# Patient Record
Sex: Female | Born: 1940 | Race: White | Hispanic: No | Marital: Married | Smoking: Never smoker
Health system: Southern US, Community
[De-identification: ages and names within clinical notes are randomized; demographics above are authoritative.]

## PROBLEM LIST (undated history)

## (undated) DIAGNOSIS — C801 Malignant (primary) neoplasm, unspecified: Secondary | ICD-10-CM

---

## 2018-01-13 ENCOUNTER — Inpatient Hospital Stay (HOSPITAL_COMMUNITY)
Admission: EM | Admit: 2018-01-13 | Discharge: 2018-01-16 | DRG: 690 | Disposition: A | Discharge status: Home Health | Payer: Medicare Other | Attending: Internal Medicine | Admitting: Internal Medicine

## 2018-01-13 ENCOUNTER — Encounter (HOSPITAL_COMMUNITY): Payer: Self-pay | Admitting: Emergency Medicine

## 2018-01-13 ENCOUNTER — Emergency Department (HOSPITAL_COMMUNITY): Payer: Medicare Other

## 2018-01-13 ENCOUNTER — Other Ambulatory Visit: Payer: Self-pay

## 2018-01-13 DIAGNOSIS — F0390 Unspecified dementia without behavioral disturbance: Secondary | ICD-10-CM | POA: Diagnosis present

## 2018-01-13 DIAGNOSIS — F05 Delirium due to known physiological condition: Secondary | ICD-10-CM | POA: Diagnosis present

## 2018-01-13 DIAGNOSIS — I609 Nontraumatic subarachnoid hemorrhage, unspecified: Secondary | ICD-10-CM

## 2018-01-13 DIAGNOSIS — R410 Disorientation, unspecified: Secondary | ICD-10-CM

## 2018-01-13 DIAGNOSIS — E039 Hypothyroidism, unspecified: Secondary | ICD-10-CM | POA: Diagnosis present

## 2018-01-13 DIAGNOSIS — N390 Urinary tract infection, site not specified: Principal | ICD-10-CM | POA: Diagnosis present

## 2018-01-13 DIAGNOSIS — G9340 Encephalopathy, unspecified: Secondary | ICD-10-CM

## 2018-01-13 DIAGNOSIS — Z79890 Hormone replacement therapy (postmenopausal): Secondary | ICD-10-CM

## 2018-01-13 HISTORY — DX: Malignant (primary) neoplasm, unspecified: C80.1

## 2018-01-13 LAB — COMPREHENSIVE METABOLIC PANEL
ALBUMIN: 4.1 g/dL (ref 3.5–5.0)
ALK PHOS: 84 U/L (ref 38–126)
ALT: 17 U/L (ref 0–44)
AST: 17 U/L (ref 15–41)
Anion gap: 10 (ref 5–15)
BUN: 22 mg/dL (ref 8–23)
CHLORIDE: 107 mmol/L (ref 98–111)
CO2: 23 mmol/L (ref 22–32)
CREATININE: 0.82 mg/dL (ref 0.44–1.00)
Calcium: 10.3 mg/dL (ref 8.9–10.3)
GFR calc Af Amer: >60 mL/min (ref >60)
GFR calc non Af Amer: >60 mL/min (ref >60)
GLUCOSE: 91 mg/dL (ref 70–99)
Potassium: 3.9 mmol/L (ref 3.5–5.1)
SODIUM: 140 mmol/L (ref 135–145)
Total Bilirubin: 0.5 mg/dL (ref 0.3–1.2)
Total Protein: 7.5 g/dL (ref 6.5–8.1)

## 2018-01-13 LAB — CBC WITH DIFFERENTIAL/PLATELET
Abs Immature Granulocytes: 0 10*3/uL (ref 0.0–0.1)
Basophils Absolute: 0.1 10*3/uL (ref 0.0–0.1)
Basophils Relative: 1 %
EOS ABS: 0.1 10*3/uL (ref 0.0–0.7)
EOS PCT: 1 %
HEMATOCRIT: 46.8 % — AB (ref 36.0–46.0)
Hemoglobin: 14.3 g/dL (ref 12.0–15.0)
IMMATURE GRANULOCYTES: 0 %
LYMPHS ABS: 1.7 10*3/uL (ref 0.7–4.0)
Lymphocytes Relative: 22 %
MCH: 28.7 pg (ref 26.0–34.0)
MCHC: 30.6 g/dL (ref 30.0–36.0)
MCV: 94 fL (ref 78.0–100.0)
Monocytes Absolute: 1 10*3/uL (ref 0.1–1.0)
Monocytes Relative: 12 %
NEUTROS PCT: 64 %
Neutro Abs: 5.1 10*3/uL (ref 1.7–7.7)
Platelets: 216 10*3/uL (ref 150–400)
RBC: 4.98 MIL/uL (ref 3.87–5.11)
RDW: 14.9 % (ref 11.5–15.5)
WBC: 7.9 10*3/uL (ref 4.0–10.5)

## 2018-01-13 LAB — TSH: TSH: 0.445 u[IU]/mL (ref 0.350–4.500)

## 2018-01-13 NOTE — ED Notes (Addendum)
Pt states bday is 1941/07/06.  I will call registration.  Waiting on new labels.

## 2018-01-13 NOTE — ED Notes (Signed)
Pt found to be redressing herself in room when this RN went into room for assessment.

## 2018-01-13 NOTE — ED Notes (Signed)
Transported to MRI

## 2018-01-13 NOTE — ED Notes (Addendum)
At [place] request, contact information for Pt's husband, who is currently on 4NP room 09. His RN Advertising account planner) can be contacted at [phone].   Marya Amsler (Daughter): [phone] Barrett Henle (Son-in-law): [phone]

## 2018-01-13 NOTE — ED Triage Notes (Signed)
Pt BIB family for evaluation of undiagnosed dementia. Pt's husband is a pt on an inpatient unit, pt has been confused and declining x years. Husband's recent admission, pt has become belligerent, and "hoarding" money.

## 2018-01-13 NOTE — ED Provider Notes (Signed)
[facility] EMERGENCY DEPARTMENT Provider Note   CSN: [id number] Arrival date & time: 01/13/18  1737     History   Chief Complaint Chief Complaint  Patient presents with  . Altered Mental Status    HPI Cheyenne Montes is a 77 y.o. female.  HPI  She here with family members, who are concerned that she is unstable, and cannot go home.  Her husband was admitted to the hospital, 5 days ago for rib injuries, and has required a prolonged stay.  The patient has been staying with her husband and is apparently very bothersome to staff, noted to be belligerent, and having difficulty with redirection.  The patient is unable to give history.  History is given by the patient's son-in-law.  Level 5 caveat-confusion  Past Medical History:  Diagnosis Date  . Cancer [facility])    breast    There are no active problems to display for this patient.   History reviewed. No pertinent surgical history.   OB History   None      Home Medications    Prior to Admission medications   Medication Sig Start Date End Date Taking? Authorizing Provider  levothyroxine (SYNTHROID, LEVOTHROID) 75 MCG tablet Take 37.5 mcg by mouth daily. 01/03/18  Yes [provider]    Family History No family history on file.  Social History Social History   Tobacco Use  . Smoking status: Not on file  Substance Use Topics  . Alcohol use: Not on file  . Drug use: Not on file     Allergies   Patient has no known allergies.   Review of Systems Review of Systems  Unable to perform ROS: Mental status change     Physical Exam Updated Vital Signs BP (!) 135/52 (BP Location: Right Arm)   Pulse 92   Temp 97.8 F (36.6 C) (Oral)   Resp 16   SpO2 97%   Physical Exam  Constitutional: She appears well-developed. No distress.  Frail, elderly  HENT:  Head: Normocephalic and atraumatic.  Eyes: Pupils are equal, round, and reactive to light. Conjunctivae and EOM are normal.    Neck: Normal range of motion and phonation normal. Neck supple.  Cardiovascular: Normal rate and regular rhythm.  Pulmonary/Chest: Effort normal and breath sounds normal. She exhibits no tenderness.  Musculoskeletal: Normal range of motion.  Neurological: She is alert. She exhibits normal muscle tone.  Oriented to person, and roughly to the date.  She did not know the year.  She did not know where she was.  No dysarthria.  Skin: Skin is warm and dry.  Psychiatric:  Suspicious, confused, anxious.  Nursing note and vitals reviewed.    ED Treatments / Results  Labs (all labs ordered are listed, but only abnormal results are displayed) Labs Reviewed - No data to display  EKG None  Radiology No results found.  Procedures Procedures (including critical care time)  Medications Ordered in ED Medications - No data to display   Initial Impression / Assessment and Plan / ED Course  I have reviewed the triage vital signs and the nursing notes.  Pertinent labs & imaging results that were available during my care of the patient were reviewed by me and considered in my medical decision making (see chart for details).      Patient Vitals for the past 24 hrs:  BP Temp Temp src Pulse Resp SpO2  01/13/18 1824 (!) 135/52 97.8 F (36.6 C) Oral 92 16 97 %  01/13/18 1756 133/64 98.2 F (36.8 C) Oral 86 14 93 %      Medical Decision Making: Confusion c/w delirium. Abnormal CT required MRI imaging to evaluate for acute hemorrhage. 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 Labs pending to evaluate for medical induced confusion.  CRITICAL CARE- No Performed by: Daleen Bo   Nursing Notes Reviewed/ Care Coordinated Applicable Imaging Reviewed Interpretation of Laboratory Data incorporated into ED treatment  Disposition per on-coming team after testing returns.   Final Clinical Impressions(s) / ED Diagnoses   Final diagnoses:  None    ED Discharge Orders    None       Daleen Bo, MD 01/14/18 1715

## 2018-01-13 NOTE — ED Notes (Signed)
States she could not provide urine specimen at this time.

## 2018-01-13 NOTE — ED Notes (Signed)
Registration currently in room.

## 2018-01-13 NOTE — ED Notes (Signed)
Patient transported to MRI 

## 2018-01-13 NOTE — ED Provider Notes (Signed)
Patient signed out to me at shift change.  She has been acting bizarre and having strange behavior according to the nurses upstairs, who have been involved in the care of the patient's husband.  Questionable underlying psychiatric illness, however patient was sent to the emergency department for evaluation of delirium.  It was found that she had a small subarachnoid hemorrhage in her cerebellar region.  She is moving all extremities, and is grossly neurologically intact, but is disoriented and delirious.  I discussed the patient with Dr. Vertell Limber from neurosurgery, who states that this is not an aneurysmal presentation, and recommends neurology consultation and admission.  Discussed the patient with Dr. Rory Percy, who states no intervention from neurology required as patient is not hypertensive.  He recommends working up other etiologies for delirium.  Appreciate Dr. Hal Hope for admitting the patient.  Urinalysis is concerning for UTI, which could be exacerbating the patient's symptoms.    CRITICAL CARE Performed by: Montine Circle Subarachnoid hemorrhage, consultation with multiple specialists.  Total critical care time: 35 minutes  Critical care time was exclusive of separately billable procedures and treating other patients.  Critical care was necessary to treat or prevent imminent or life-threatening deterioration.  Critical care was time spent personally by me on the following activities: development of treatment plan with patient and/or surrogate as well as nursing, discussions with consultants, evaluation of patient's response to treatment, examination of patient, obtaining history from patient or surrogate, ordering and performing treatments and interventions, ordering and review of laboratory studies, ordering and review of radiographic studies, pulse oximetry and re-evaluation of patient's condition.    Montine Circle, PA-C 01/14/18 1624    Daleen Bo, MD 01/14/18 1710

## 2018-01-13 NOTE — Progress Notes (Signed)
[facility] ED CSW spoke with pt's son in law, [place]. Pt did not participate in the conversation, but gave verbal permission for Suezanne Jacquet to speak with CSW and RN CM. Pt's family informed staff that pt needs to be evaluated and diagnosed with dimentia. Staff explained to family that is something a PCP and/or neurologist would do and encouraged family to follow up with pt's PCP. CSW answered questions about placement for pt. CSW explained that pt would have to be agreeable to going to a facility and that ALFs/Memory Cares are paid for by out of pocket or medicaid. CSW answered questions from family about Medical Power of Attorney and Chief Financial Officer and the process for both. CSW explained to family that pt would have to be deemed incompetent for them to obtain legal guardianship of pt.   Please re-consult for further social work needs. CSW signing off.   Wendelyn Breslow, [facility]  [phone]

## 2018-01-13 NOTE — ED Provider Notes (Signed)
Patient placed in Quick Look pathway, seen and evaluated   Chief Complaint: change in mental status  HPI: Cheyenne Montes is a 77 y.o. femalet BIB family for evaluation of undiagnosed dementia. Pt's husband is a pt on an inpatient unit, pt has been confused and declining x years. Husband's recent admission, pt has become belligerent, and "hoarding" money.    Patient states she was brought in to the hospital today by her husband and daughter-in-low and patient does not know why and just wants to go home.   ROS: patient reports no problems, just wants to go home.  Physical Exam:  BP 133/64 (BP Location: Right Arm)   Pulse 86   Temp 98.2 F (36.8 C) (Oral)   Resp 14   SpO2 93%    Gen: No distress, patient was in room alone and talking when I came into the room.   Neuro: Awake and Alert,   Skin: Warm and dry     Initiation of care has begun. The patient has been counseled on the process, plan, and necessity for staying for the completion/evaluation, and the remainder of the medical screening examination    Cheyenne Murrain, NP 01/13/18 Cheyenne Montes    Cheyenne Furry, MD 01/13/18 2229

## 2018-01-14 ENCOUNTER — Encounter (HOSPITAL_COMMUNITY): Payer: Self-pay | Admitting: Internal Medicine

## 2018-01-14 DIAGNOSIS — I609 Nontraumatic subarachnoid hemorrhage, unspecified: Secondary | ICD-10-CM

## 2018-01-14 DIAGNOSIS — N390 Urinary tract infection, site not specified: Secondary | ICD-10-CM | POA: Diagnosis present

## 2018-01-14 DIAGNOSIS — G9340 Encephalopathy, unspecified: Secondary | ICD-10-CM

## 2018-01-14 DIAGNOSIS — R410 Disorientation, unspecified: Secondary | ICD-10-CM

## 2018-01-14 DIAGNOSIS — E039 Hypothyroidism, unspecified: Secondary | ICD-10-CM | POA: Diagnosis not present

## 2018-01-14 LAB — BASIC METABOLIC PANEL
ANION GAP: 8 (ref 5–15)
BUN: 16 mg/dL (ref 8–23)
CALCIUM: 9.9 mg/dL (ref 8.9–10.3)
CO2: 26 mmol/L (ref 22–32)
CREATININE: 0.69 mg/dL (ref 0.44–1.00)
Chloride: 106 mmol/L (ref 98–111)
Glucose, Bld: 96 mg/dL (ref 70–99)
Potassium: 3.5 mmol/L (ref 3.5–5.1)
SODIUM: 140 mmol/L (ref 135–145)

## 2018-01-14 LAB — CBC WITH DIFFERENTIAL/PLATELET
Abs Immature Granulocytes: 0 10*3/uL (ref 0.0–0.1)
BASOS ABS: 0.1 10*3/uL (ref 0.0–0.1)
BASOS PCT: 1 %
EOS ABS: 0.1 10*3/uL (ref 0.0–0.7)
Eosinophils Relative: 2 %
HEMATOCRIT: 42.9 % (ref 36.0–46.0)
Hemoglobin: 13.6 g/dL (ref 12.0–15.0)
IMMATURE GRANULOCYTES: 0 %
Lymphocytes Relative: 20 %
Lymphs Abs: 1.3 10*3/uL (ref 0.7–4.0)
MCH: 28.8 pg (ref 26.0–34.0)
MCHC: 31.7 g/dL (ref 30.0–36.0)
MCV: 90.9 fL (ref 78.0–100.0)
MONOS PCT: 14 %
Monocytes Absolute: 0.9 10*3/uL (ref 0.1–1.0)
NEUTROS ABS: 4.1 10*3/uL (ref 1.7–7.7)
NEUTROS PCT: 63 %
Platelets: 243 10*3/uL (ref 150–400)
RBC: 4.72 MIL/uL (ref 3.87–5.11)
RDW: 14.6 % (ref 11.5–15.5)
WBC: 6.5 10*3/uL (ref 4.0–10.5)

## 2018-01-14 LAB — URINALYSIS, ROUTINE W REFLEX MICROSCOPIC
Bilirubin Urine: NEGATIVE
GLUCOSE, UA: NEGATIVE mg/dL
Ketones, ur: 20 mg/dL — AB
Leukocytes, UA: NEGATIVE
Nitrite: POSITIVE — AB
PH: 6 (ref 5.0–8.0)
Protein, ur: NEGATIVE mg/dL
SPECIFIC GRAVITY, URINE: 1.016 (ref 1.005–1.030)

## 2018-01-14 LAB — HEPATIC FUNCTION PANEL
ALT: 19 U/L (ref 0–44)
AST: 21 U/L (ref 15–41)
Albumin: 3.8 g/dL (ref 3.5–5.0)
Alkaline Phosphatase: 88 U/L (ref 38–126)
BILIRUBIN DIRECT: 0.2 mg/dL (ref 0.0–0.2)
BILIRUBIN INDIRECT: 0.6 mg/dL (ref 0.3–0.9)
BILIRUBIN TOTAL: 0.8 mg/dL (ref 0.3–1.2)
Total Protein: 7.3 g/dL (ref 6.5–8.1)

## 2018-01-14 LAB — RAPID URINE DRUG SCREEN, HOSP PERFORMED
Amphetamines: NOT DETECTED
BENZODIAZEPINES: NOT DETECTED
Cocaine: NOT DETECTED
Opiates: NOT DETECTED
TETRAHYDROCANNABINOL: NOT DETECTED

## 2018-01-14 LAB — RPR: RPR Ser Ql: NONREACTIVE

## 2018-01-14 LAB — AMMONIA: Ammonia: 30 umol/L (ref 9–35)

## 2018-01-14 LAB — VITAMIN B12: VITAMIN B 12: 519 pg/mL (ref 180–914)

## 2018-01-14 MED ORDER — SODIUM CHLORIDE 0.9 % IV SOLN
1.0000 g | INTRAVENOUS | Status: DC
  Administered 2018-01-14 – 2018-01-15 (×2): 1 g via INTRAVENOUS
  Filled 2018-01-14 (×3): qty 10

## 2018-01-14 MED ORDER — ONDANSETRON HCL 4 MG/2ML IJ SOLN: 4.0000 mg | Freq: Four times a day (QID) | INTRAMUSCULAR | Status: DC | PRN

## 2018-01-14 MED ORDER — ACETAMINOPHEN 650 MG RE SUPP
650.0000 mg | Freq: Four times a day (QID) | RECTAL | Status: DC | PRN
Start: 2018-01-14 — End: 2018-01-16

## 2018-01-14 MED ORDER — ONDANSETRON HCL 4 MG PO TABS: 4.0000 mg | ORAL_TABLET | Freq: Four times a day (QID) | ORAL | Status: DC | PRN

## 2018-01-14 MED ORDER — LEVOTHYROXINE SODIUM 75 MCG PO TABS
37.5000 ug | ORAL_TABLET | Freq: Every day | ORAL | Status: DC
  Administered 2018-01-14 – 2018-01-16 (×3): 37.5 ug via ORAL
  Filled 2018-01-14 (×3): qty 1

## 2018-01-14 MED ORDER — ACETAMINOPHEN 325 MG PO TABS: 650.0000 mg | ORAL_TABLET | Freq: Four times a day (QID) | ORAL | Status: DC | PRN

## 2018-01-14 MED ORDER — SODIUM CHLORIDE 0.9 % IV SOLN
1.0000 g | Freq: Once | INTRAVENOUS | Status: AC
  Administered 2018-01-14: 1 g via INTRAVENOUS
  Filled 2018-01-14: qty 10

## 2018-01-14 NOTE — H&P (Signed)
History and Physical    Cheyenne Montes YQI:[id number] DOB: 15-Sep-1940 DOA: 01/13/2018  PCP: Patient, No Pcp Per  Patient coming from: Home.  Chief Complaint: Confusion.  HPI: Cheyenne Montes is a 77 y.o. female with history of hypothyroidism was found to be confused.  Patient is visiting hospital as her husband was hospitalized.  Nurses noted that patient was getting confused and delirious.  Was referred to the ER.  Patient does not complain of any headache nausea vomiting or any weakness or fall.  ED Course: In the ER patient is afebrile and appears nonfocal but confused.  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  On-call neurosurgeon Dr. Vertell Limber was consulted by the ER physician.  Dr. Vertell Limber advised that this is not a similar presentation and recommended neurology consult.  Dr. Malen Gauze of neurology was contacted and at this time Dr. Malen Gauze felt that patient does not have any acute bleed and is chronic and does not need any further work-up.  UA shows features consistent with UTI with ceftriaxone was started on patient's confusion is likely from UTI as per the neurology.  Review of Systems: As per HPI, rest all negative.   Past Medical History:  Diagnosis Date  . Cancer [facility])    breast    History reviewed. No pertinent surgical history.   reports that she has never smoked. She has never used smokeless tobacco. Her alcohol and drug histories are not on file.  No Known Allergies  Family History  Problem Relation Age of Onset  . Diabetes Mellitus II Neg Hx   . CAD Neg Hx     Prior to Admission medications   Medication Sig Start Date End Date Taking? Authorizing Provider  levothyroxine (SYNTHROID, LEVOTHROID) 75 MCG tablet Take 37.5 mcg by mouth daily. 01/03/18  Yes [provider]    Physical Exam: Vitals:   01/14/18 0000 01/14/18 0100 01/14/18 0200 01/14/18 0334  BP: (!) 126/59 (!)  114/53 (!) 105/52 138/78  Pulse: 85 70 72 90  Resp:    18  Temp:    97.6 F (36.4 C)  TempSrc:    Oral  SpO2: 96% 94% 94% 99%      Constitutional: Moderately built and nourished. Vitals:   01/14/18 0000 01/14/18 0100 01/14/18 0200 01/14/18 0334  BP: (!) 126/59 (!) 114/53 (!) 105/52 138/78  Pulse: 85 70 72 90  Resp:    18  Temp:    97.6 F (36.4 C)  TempSrc:    Oral  SpO2: 96% 94% 94% 99%   Eyes: Anicteric no pallor. ENMT: No discharge from the ears eyes nose or mouth. Neck: No mass palpated no neck rigidity no JVD appreciated. Respiratory: No rhonchi or crepitations. Cardiovascular: S1-S2 heard no murmurs appreciated. Abdomen: Soft nontender bowel sounds present. Musculoskeletal: No edema. Skin: No rash. Neurologic: Alert awake oriented to person and place.  Moves all extremity's 5 x 5. Psychiatric: Appears mildly confused.   Labs on Admission: I have personally reviewed following labs and imaging studies  CBC: Recent Labs  Lab 01/13/18 2020  WBC 7.9  NEUTROABS 5.1  HGB 14.3  HCT 46.8*  MCV 94.0  PLT 387   Basic Metabolic Panel: Recent Labs  Lab 01/13/18 2020  NA 140  K 3.9  CL 107  CO2 23  GLUCOSE 91  BUN 22  CREATININE 0.82  CALCIUM 10.3   GFR: CrCl cannot be calculated (  Unknown ideal weight.). Liver Function Tests: Recent Labs  Lab 01/13/18 2020  AST 17  ALT 17  ALKPHOS 84  BILITOT 0.5  PROT 7.5  ALBUMIN 4.1   No results for input(s): LIPASE, AMYLASE in the last 168 hours. No results for input(s): AMMONIA in the last 168 hours. Coagulation Profile: No results for input(s): INR, PROTIME in the last 168 hours. Cardiac Enzymes: No results for input(s): CKTOTAL, CKMB, CKMBINDEX, TROPONINI in the last 168 hours. BNP (last 3 results) No results for input(s): PROBNP in the last 8760 hours. HbA1C: No results for input(s): HGBA1C in the last 72 hours. CBG: No results for input(s): GLUCAP in the last 168 hours. Lipid Profile: No results  for input(s): CHOL, HDL, LDLCALC, TRIG, CHOLHDL, LDLDIRECT in the last 72 hours. Thyroid Function Tests: Recent Labs    01/13/18 2020  TSH 0.445   Anemia Panel: No results for input(s): VITAMINB12, FOLATE, FERRITIN, TIBC, IRON, RETICCTPCT in the last 72 hours. Urine analysis:    Component Value Date/Time   COLORURINE YELLOW 01/14/2018 0128   APPEARANCEUR HAZY (A) 01/14/2018 0128   LABSPEC 1.016 01/14/2018 0128   PHURINE 6.0 01/14/2018 0128   GLUCOSEU NEGATIVE 01/14/2018 0128   HGBUR SMALL (A) 01/14/2018 0128   BILIRUBINUR NEGATIVE 01/14/2018 0128   KETONESUR 20 (A) 01/14/2018 0128   PROTEINUR NEGATIVE 01/14/2018 0128   NITRITE POSITIVE (A) 01/14/2018 0128   LEUKOCYTESUR NEGATIVE 01/14/2018 0128   Sepsis Labs: @LABRCNTIP (procalcitonin:4,lacticidven:4) )No results found for this or any previous visit (from the past 240 hour(s)).   Radiological Exams on Admission: Ct Head Wo Contrast  Result Date: 01/13/2018 CLINICAL DATA:  Altered mental status EXAM: CT HEAD WITHOUT CONTRAST TECHNIQUE: Contiguous axial images were obtained from the base of the skull through the vertex without intravenous contrast. COMPARISON:  None. FINDINGS: Brain: There is a small focus of hyperattenuation in the inferior right cerebellum (axial image 6, sagittal image 26). There is generalized atrophy without lobar predilection. There is an old posterior right MCA territory infarct with associated encephalomalacia. There is hypoattenuation of the periventricular white matter, most commonly indicating chronic ischemic microangiopathy. Vascular: Atherosclerotic calcification of the internal carotid arteries at the skull base. No abnormal hyperdensity of the major intracranial arteries or dural venous sinuses. Skull: The visualized skull base, calvarium and extracranial soft tissues are normal. Sinuses/Orbits: No fluid levels or advanced mucosal thickening of the visualized paranasal sinuses. No mastoid or middle ear  effusion. The orbits are normal. IMPRESSION: 1. Small focus of suspected acute to subacute subarachnoid hemorrhage in the right cerebellum. No associated mass effect. This may be posttraumatic, but MRI might be helpful to exclude an associated lesion, such as a cavernoma. 2. 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 Critical Value/emergent results were called by telephone at the time of interpretation on 01/13/2018 at 8:41 pm to Dr. Daleen Bo , who verbally acknowledged these results. Electronically Signed   By: Ulyses Jarred M.D.   On: 01/13/2018 20:52   Mr Brain Wo Contrast  Addendum Date: 01/14/2018   ADDENDUM REPORT: 01/14/2018 02:27 ADDENDUM: Subarachnoid hemorrhage within the right inferior cerebellar hemisphere demonstrates low signal on all sequences compatible with chronic hemorrhage. Increased density on CT is likely mineralization associated with the chronic hemorrhage. No edema or mass effect. These findings were discussed on 01/14/2018 at 2:25 am with Dr. Rory Percy. Electronically Signed   By: Kristine Garbe M.D.   On: 01/14/2018 02:27   Result Date: 01/14/2018 CLINICAL DATA:  77 y/o F;  stroke for follow-up. Altered mental status. EXAM: MRI HEAD WITHOUT CONTRAST TECHNIQUE: Multiplanar, multiecho pulse sequences of the brain and surrounding structures were obtained without intravenous contrast. COMPARISON:  01/13/2018 CT head FINDINGS: Brain: No acute infarction, hydrocephalus, extra-axial collection or mass lesion. Chronic right posterior MCA distribution infarction involving the lateral temporal and parietal lobes. Multiple small chronic infarcts within the bilateral cerebellar hemispheres. Ex vacuo dilatation of the temporal horn of right lateral ventricle. Patchy nonspecific foci of T2 FLAIR hyperintense signal abnormality in subcortical and periventricular white matter are compatible with moderate chronic microvascular ischemic changes for age.  Moderate brain parenchymal volume loss. There is hemosiderin staining of a infarct in the right posterior temporal lobe area of infarction. Small volume of subarachnoid hemorrhage overlying the right inferior cerebellar hemisphere. Vascular: Normal flow voids. Skull and upper cervical spine: Normal marrow signal. Sinuses/Orbits: Small mucous retention cyst in the right maxillary sinus. Right globe intra-ocular lens replacement. Other: None. IMPRESSION: 1. No acute/early subacute infarct, focal mass effect, herniation. 2. Stable small focus of subarachnoid hemorrhage overlying right inferior cerebellum given differences in technique. 3. Chronic right posterior MCA distribution infarction and multiple small chronic infarctions within the bilateral cerebellar hemispheres. 4. Moderate for age chronic microvascular ischemic changes and parenchymal volume loss of the brain. Electronically Signed: By: Kristine Garbe M.D. On: 01/13/2018 22:25      Assessment/Plan Principal Problem:   Acute encephalopathy Active Problems:   Delirium   Hypothyroidism   SAH (subarachnoid hemorrhage) (HCC)   Acute lower UTI    1. Acute encephalopathy -discussed with neurologist Dr. Malen Gauze who at this time feels patient's subarachnoid hemorrhage seen on MRI is likely chronic and does not require any intervention and patient's confusion is likely from UTI for which patient is already placed on ceftriaxone follow urine cultures.  In addition we will check ammonia TSH RPR and folate and B12 levels. 2. Subarachnoid hemorrhage -as per the neurologist is chronic and does not require any intervention.  Neurosurgeon at this time has recommended nonsurgical does not have any signs of aneurysm. 3. Hypothyroidism on Synthroid.   DVT prophylaxis: SCDs due to subarachnoid hemorrhage. Code Status: Full code. Family Communication: No family at the bedside. Disposition Plan: To be determined. Consults called: Physical  therapy. Admission status: Observation.   Rise Patience MD [facility] Pager [phone].  If 7PM-7AM, please contact night-coverage [email or web address] Password [facility]  01/14/2018, 5:19 AM

## 2018-01-14 NOTE — Progress Notes (Signed)
Pt was concerned about having Prescription Synthroid she brought from home with her and in her pocket. I informed pt that it was preferred to keep medicine in patient belonging bag in room so that it can be sure medicine is not being administered more frequently than prescribed (since pt exhibited signs of confusion). Pt appeared anxious and upset about not being able to keep medicine on person.   RN was told by patient experience representative when rounding that patient requested to have belonging bag. When bag was given to her, patient removed prescription synthroid medicine and put bottle with pills into her coat pocket.

## 2018-01-14 NOTE — Progress Notes (Signed)
Patient admitted to [phone] alert and oriented to self and place, No family member present. Made her comfortable in bed and cardiac monitor applied. Safety measures put in place and call bell placed within reach. Will keep monitoring

## 2018-01-14 NOTE — Progress Notes (Signed)
Patient seen and examined, admitted earlier this morning by Dr. Raliegh Ip -Briefly Cheyenne Montes is a 77 year old female with hypothyroidism and ongoing memory, cognitive decline and confusion for more than one year. -her husband who cares for her is currently [place] And patient was noted to be belligerent confused by his side, subsequently brought by stepchildren to the emergency room.  Progressive cognitive decline, memory loss, confusion -symptoms for >1year, with cared for by spouse at home -Suspect undiagnosed dementia -Follow-up TSH and B-12 level  Pyuria/possible UTI -Continue ceftriaxone, follow up urine cultures  Small chronic subarachnoid hemorrhage -Incidentally noted on imaging -MRI noted, chronic changes evident -This was discussed with neurology Dr.Arora and Neurosurgery Dr.Stern last pm on admission, no further workup recommended at this time and felt to be unlikely to be contributing to current presentation  Hypothyroidism -Continue Synthroid, follow-up TSH  Social work and Tourist information centre manager consulted for disposition options  Domenic Polite, MD

## 2018-01-14 NOTE — ED Notes (Addendum)
Pt expressed strong desire not to be admitted to hospital, evaluated orientation. Pt is oriented to person and time, but confused on place ("one of those places they take you where things go wrong") and inquired about thyroid medication twice in brief conversation.  PA informed, PA determined pt is not capable of making this decision.

## 2018-01-14 NOTE — Care Management Note (Signed)
Case Management Note  Patient Details  Name: Cheyenne Montes MRN: [id number] Date of Birth: September 05, 1940  Subjective/Objective:           Along w CSW spoke w stepdaughter, discussed disposition. Explained patient does not qualify for Medicare to cover SNF. She is agreeable o setting up personal care services to cover 24 hour supervision and patient being discharged to home. She was provided with resource for private duty care givers, ALF resources, and a contact to assist with ALF placement as outpatient. She did request number for neurologist, will place info on AVS for them to schedule appointment to eval for dementia. She was made aware that DC could be as early as tomorrow pending clinical improvement. No other CM needs.         Action/Plan:   Expected Discharge Date:                  Expected Discharge Plan:  [place]  In-House Referral:     Discharge planning Services  CM Consult  Post Acute Care Choice:    Choice offered to:     DME Arranged:    DME Agency:     HH Arranged:    [place] Agency:     Status of Service:  Completed, signed off  If discussed at [employer] of Stay Meetings, dates discussed:    Additional Comments:  Carles Collet, RN 01/14/2018, 3:23 PM

## 2018-01-14 NOTE — Care Management Note (Signed)
Case Management Note  Patient Details  Name: Cheyenne Montes MRN: [id number] Date of Birth: 02-Mar-1941  Subjective/Objective:             Patient in observation for confusion, underlying dementia undiagnosed exacerbated by UTI being treated acutely with Rocephin.   Socially, patient's husband and caretaker is admitted with plans for rehab at DC. Concerns for patient's abilty to care for self  In that time frame led to assessment through ED. Patient in observation status 6/28 0239, will not meet 2 midnights until 6/30, and currently does not meet inpatient criteria. Patient would require 3 day inpatient stay for Medicare to pay for short term SNF. Will plan on discussing disposition with CSW and family. Unless able to pay out of pocket, as described in ED CSW notes, family will be responsible for assisting with care and supervision with help of home health services at DC.    Action/Plan:   Expected Discharge Date:                  Expected Discharge Plan:  [place]  In-House Referral:     Discharge planning Services  CM Consult  Post Acute Care Choice:    Choice offered to:     DME Arranged:    DME Agency:     HH Arranged:    [place] Agency:     Status of Service:  In process, will continue to follow  If discussed at Long Length of Stay Meetings, dates discussed:    Additional Comments:  Carles Collet, RN 01/14/2018, 1:32 PM

## 2018-01-14 NOTE — Progress Notes (Signed)
CSW following for discharge plan. CSW met with patient's step-daughter, Cheyenne Montes, earlier today and this afternoon to discuss current situation. Patient's husband is a patient on 4NP at [facility], and he is usually the primary caretaker for the patient. Patient has been causing disruptions for staff trying to care for the patient's husband, so the patient was brought in to evaluate possibility for placement.   Patient is here under observation, will not switch to inpatient; will not meet qualifying stay criteria for SNF placement. Patient will need to go home with private duty caregivers, while step-daughter looks into whether or not patient and/or patient's husband will end up needing long term care placement. CSW provided patient's step-daughter with information on ALF options in the [place] area as well as a resource for assisting her with locating long term care options.   Patient will discharge home, no further CSW assistance needed at this time. CSW signing off.  Laveda Abbe, [place] Clinical Social Worker [phone]

## 2018-01-14 NOTE — Evaluation (Signed)
Physical Therapy Evaluation Patient Details Name: Cheyenne Montes MRN: [id number] DOB: 1941-07-16 Today's Date: 01/14/2018   History of Present Illness  Patient is a 77 y/o female presenting to the ED on 01/13/18 with confusion. 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 UA shows features consistent with UTI. Patient with a PMH significant for hypothyroidism.    Clinical Impression  Cheyenne Montes admitted with the above listed diagnosis. Unsure of accuracy of PLOF as patient is mildly confused and often changes what she is trying to explain. No family present until end of session, with family stating that they feel patient is more confused as compared to baseline. Patient today very impulsive with all activity with noted unsteady gait pattern increasing her fall risk. Education on safety throughout session with limited carryover and patient stating multiple times she is "ready to go home" and that "once my husband gets here I'm going to leave" - of note, patients husband is currently admitted at [facility]. PT currently recommending SNF due to patients decreased safety and functional mobility. PT to continue to follow acutely.    Follow Up Recommendations SNF;Supervision/Assistance - 24 hour    Equipment Recommendations  Other (comment)(TBD at next venue of care)    Recommendations for Other Services       Precautions / Restrictions Precautions Precautions: Fall Restrictions Weight Bearing Restrictions: No      Mobility  Bed Mobility Overal bed mobility: Modified Independent             General bed mobility comments: very impulsive  Transfers Overall transfer level: Needs assistance Equipment used: None Transfers: Sit to/from Stand Sit to Stand: Min guard;Supervision         General transfer comment: impulsive; Min guard for general safety and immediate standing  balance  Ambulation/Gait Ambulation/Gait assistance: Min guard Gait Distance (Feet): 150 Feet Assistive device: 1 person hand held assist Gait Pattern/deviations: Step-through pattern;Decreased stride length;Drifts right/left;Trunk flexed;Narrow base of support Gait velocity: changing gait speeds throughout activity   General Gait Details: balance deficts apparent with patient impulsive and wanting to change position quickly increasing her fall risk.   Stairs Stairs: Yes Stairs assistance: Min guard Stair Management: Two rails;Step to pattern;Forwards Number of Stairs: 2 General stair comments: hesitancy with descending stairs; requires use of B handrail for safety  Wheelchair Mobility    Modified Rankin (Stroke Patients Only)       Balance Overall balance assessment: Needs assistance Sitting-balance support: No upper extremity supported;Feet supported Sitting balance-Leahy Scale: Good     Standing balance support: Single extremity supported;During functional activity Standing balance-Leahy Scale: Fair                   Standardized Balance Assessment Standardized Balance Assessment : (attempted DGI - unable to follow commands)           Pertinent Vitals/Pain Pain Assessment: No/denies pain    Home Living Family/patient expects to be discharged to:: Private residence Living Arrangements: Spouse/significant other Available Help at Discharge: Family;Available PRN/intermittently Type of Home: House Home Access: Stairs to enter Entrance Stairs-Rails: None Entrance Stairs-Number of Steps: 2 Home Layout: Two level;Able to live on main level with bedroom/bathroom Home Equipment: Grab bars - tub/shower;Grab bars - toilet Additional Comments: unsure of accuracy of PLOF    Prior Function Level of Independence: Independent         Comments: patient reports independence without use of AD for  mobility     Hand Dominance        Extremity/Trunk  Assessment        Lower Extremity Assessment Lower Extremity Assessment: Generalized weakness    Cervical / Trunk Assessment Cervical / Trunk Assessment: Kyphotic  Communication   Communication: No difficulties  Cognition Arousal/Alertness: Awake/alert Behavior During Therapy: WFL for tasks assessed/performed Overall Cognitive Status: Impaired/Different from baseline Area of Impairment: Orientation;Following commands;Safety/judgement;Problem solving                 Orientation Level: Disoriented to;Place;Situation     Following Commands: Follows one step commands with increased time;Follows one step commands inconsistently Safety/Judgement: Decreased awareness of safety;Decreased awareness of deficits   Problem Solving: Slow processing;Decreased initiation;Difficulty sequencing;Requires verbal cues;Requires tactile cues General Comments: patient stating multiple times that she is safe and ready to go home. Patient did become agitated when she found medication a medication bottle and was unable to take her own meds even with PT educating that nurse had likely given her prescribed medication.       General Comments      Exercises     Assessment/Plan    PT Assessment Patient needs continued PT services  PT Problem List Decreased strength;Decreased activity tolerance;Decreased balance;Decreased mobility;Decreased knowledge of use of DME;Decreased safety awareness;Decreased knowledge of precautions       PT Treatment Interventions DME instruction;Gait training;Stair training;Functional mobility training;Therapeutic activities;Therapeutic exercise;Balance training;Neuromuscular re-education;Cognitive remediation;Patient/family education    PT Goals (Current goals can be found in the Care Plan section)  Acute Rehab PT Goals Patient Stated Goal: return home PT Goal Formulation: With patient Time For Goal Achievement: 01/28/18 Potential to Achieve Goals: Good     Frequency Min 2X/week   Barriers to discharge        Co-evaluation               AM-PAC PT "6 Clicks" Daily Activity  Outcome Measure Difficulty turning over in bed (including adjusting bedclothes, sheets and blankets)?: A Little Difficulty moving from lying on back to sitting on the side of the bed? : A Little Difficulty sitting down on and standing up from a chair with arms (e.g., wheelchair, bedside commode, etc,.)?: Unable Help needed moving to and from a bed to chair (including a wheelchair)?: A Little Help needed walking in hospital room?: A Little Help needed climbing 3-5 steps with a railing? : A Little 6 Click Score: 16    End of Session Equipment Utilized During Treatment: Gait belt Activity Tolerance: Patient tolerated treatment well Patient left: in bed;with call bell/phone within reach;with bed alarm set;with nursing/sitter in room;with family/visitor present Nurse Communication: Mobility status PT Visit Diagnosis: Unsteadiness on feet (R26.81);Other abnormalities of gait and mobility (R26.89);Muscle weakness (generalized) (M62.81)    Time: 9357-0177 PT Time Calculation (min) (ACUTE ONLY): 29 min   Charges:   PT Evaluation $PT Eval Low Complexity: 1 Low PT Treatments $Gait Training: 8-22 mins   PT G Codes:        Lanney Gins, PT, DPT 01/14/18 10:29 AM

## 2018-01-15 ENCOUNTER — Other Ambulatory Visit: Payer: Self-pay

## 2018-01-15 DIAGNOSIS — N390 Urinary tract infection, site not specified: Secondary | ICD-10-CM | POA: Diagnosis present

## 2018-01-15 DIAGNOSIS — R410 Disorientation, unspecified: Secondary | ICD-10-CM | POA: Diagnosis present

## 2018-01-15 DIAGNOSIS — E039 Hypothyroidism, unspecified: Secondary | ICD-10-CM | POA: Diagnosis present

## 2018-01-15 DIAGNOSIS — I609 Nontraumatic subarachnoid hemorrhage, unspecified: Secondary | ICD-10-CM | POA: Diagnosis not present

## 2018-01-15 DIAGNOSIS — G9340 Encephalopathy, unspecified: Secondary | ICD-10-CM | POA: Diagnosis not present

## 2018-01-15 DIAGNOSIS — F05 Delirium due to known physiological condition: Secondary | ICD-10-CM | POA: Diagnosis present

## 2018-01-15 DIAGNOSIS — Z79890 Hormone replacement therapy (postmenopausal): Secondary | ICD-10-CM | POA: Diagnosis not present

## 2018-01-15 DIAGNOSIS — F0390 Unspecified dementia without behavioral disturbance: Secondary | ICD-10-CM | POA: Diagnosis present

## 2018-01-15 NOTE — Care Management Obs Status (Signed)
[place] NOTIFICATION   Patient Details  Name: Cheyenne Montes MRN: [id number] Date of Birth: [birth year]   Medicare Observation Status Notification Given:  Yes Discussed with step daughter 01/14/18, copy left in room   Carles Collet, RN 01/15/2018, 10:50 AM

## 2018-01-15 NOTE — Evaluation (Signed)
Occupational Therapy Evaluation Patient Details Name: Cheyenne Montes MRN: [id number] DOB: 1941/04/03 Today's Date: 01/15/2018    History of Present Illness Patient is a 77 y/o female presenting to the ED on 01/13/18 with confusion. 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 UA shows features consistent with UTI. Patient with a PMH significant for hypothyroidism.   Clinical Impression   Pt admitted with the above.  She demonstrates the below listed deficits.  She currently requires min guard assist to min A for ADLs.  Per chart review, she lived with spouse PTA, who was primary caregiver, and who is currently a patient on 4NP.   Plan is for pt to return home with [facility].  Will defer further OT to [place].     Follow Up Recommendations  Home health OT;Supervision/Assistance - 24 hour    Equipment Recommendations  Tub/shower seat    Recommendations for Other Services       Precautions / Restrictions Precautions Precautions: Fall Restrictions Weight Bearing Restrictions: No      Mobility Bed Mobility               General bed mobility comments: Pt sitting up in chair   Transfers Overall transfer level: Needs assistance Equipment used: None Transfers: Sit to/from Stand Sit to Stand: Min guard         General transfer comment: mildly unsteady     Balance Overall balance assessment: Needs assistance Sitting-balance support: No upper extremity supported;Feet supported Sitting balance-Leahy Scale: Good     Standing balance support: During functional activity;No upper extremity supported Standing balance-Leahy Scale: Fair                             ADL either performed or assessed with clinical judgement   ADL Overall ADL's : Needs assistance/impaired Eating/Feeding: Independent   Grooming: Wash/dry hands;Wash/dry face;Oral care;Brushing hair;Min  guard;Standing Grooming Details (indicate cue type and reason): requires cues for thoroughness  Upper Body Bathing: Supervision/ safety;Minimal assistance;Sitting Upper Body Bathing Details (indicate cue type and reason): assist for thoroughness Lower Body Bathing: Minimal assistance;Sit to/from stand Lower Body Bathing Details (indicate cue type and reason): assist for thoroughness Upper Body Dressing : Supervision/safety;Sitting   Lower Body Dressing: Min guard;Sit to/from stand   Toilet Transfer: Min guard;Ambulation;Comfort height toilet;Grab bars   Toileting- Clothing Manipulation and Hygiene: Minimal assistance;Sit to/from stand Toileting - Clothing Manipulation Details (indicate cue type and reason): assist for thoroughness     Functional mobility during ADLs: Min guard       Vision Baseline Vision/History: Wears glasses Wears Glasses: At all times       Perception     Praxis      Pertinent Vitals/Pain Pain Assessment: No/denies pain     Hand Dominance     Extremity/Trunk Assessment Upper Extremity Assessment Upper Extremity Assessment: Overall WFL for tasks assessed   Lower Extremity Assessment Lower Extremity Assessment: Overall WFL for tasks assessed   Cervical / Trunk Assessment Cervical / Trunk Assessment: Kyphotic   Communication Communication Communication: No difficulties   Cognition Arousal/Alertness: Awake/alert Behavior During Therapy: WFL for tasks assessed/performed Overall Cognitive Status: No family/caregiver present to determine baseline cognitive functioning Area of Impairment: Orientation;Following commands;Safety/judgement;Problem solving                 Orientation Level: Disoriented to;Place;Situation;Time     Following Commands:  Follows one step commands with increased time;Follows one step commands inconsistently Safety/Judgement: Decreased awareness of safety;Decreased awareness of deficits   Problem Solving: Slow  processing;Decreased initiation;Difficulty sequencing;Requires verbal cues;Requires tactile cues General Comments: Pt with h/o dementia.  family not available to determine pt's baseline    General Comments       Exercises     Shoulder Instructions      Home Living Family/patient expects to be discharged to:: Private residence Living Arrangements: Spouse/significant other Available Help at Discharge: Available PRN/intermittently;Family;Personal care attendant Type of Home: House Home Access: Stairs to enter [employer] of Steps: 2 Entrance Stairs-Rails: None Home Layout: Two level;Able to live on main level with bedroom/bathroom     Bathroom Shower/Tub: Occupational psychologist: Standard     Home Equipment: Grab bars - tub/shower;Grab bars - toilet   Additional Comments: unsure of accuracy of PLOF.  Per chart review, pt's spouse is a patient on 4nP, and plan for pt is to discharge home with [facility]       Prior Functioning/Environment Level of Independence: Independent        Comments: patient reports independence without use of AD for mobility        OT Problem List: Decreased activity tolerance;Impaired balance (sitting and/or standing);Decreased cognition;Decreased safety awareness      OT Treatment/Interventions:      OT Goals(Current goals can be found in the care plan section) Acute Rehab OT Goals Patient Stated Goal: return home OT Goal Formulation: All assessment and education complete, DC therapy  OT Frequency:     Barriers to D/C:            Co-evaluation              AM-PAC PT "6 Clicks" Daily Activity     Outcome Measure Help from another person eating meals?: None Help from another person taking care of personal grooming?: A Little Help from another person toileting, which includes using toliet, bedpan, or urinal?: A Little Help from another person bathing (including washing, rinsing, drying)?: A Little Help  from another person to put on and taking off regular upper body clothing?: A Little Help from another person to put on and taking off regular lower body clothing?: A Little 6 Click Score: 19   End of Session Nurse Communication: Mobility status  Activity Tolerance: Patient tolerated treatment well Patient left: in chair;with call bell/phone within reach;with chair alarm set  OT Visit Diagnosis: Unsteadiness on feet (R26.81);Cognitive communication deficit (R41.841)                Time: 4825-0037 OT Time Calculation (min): 14 min Charges:  OT General Charges $OT Visit: 1 Visit OT Evaluation $OT Eval Moderate Complexity: 1 Mod G-Codes:     [facility], OTR/L [phone]   Lucille Passy M 01/15/2018, 4:14 PM

## 2018-01-15 NOTE — Care Management Note (Signed)
Case Management Note  Patient Details  Name: Cheyenne Montes MRN: [id number] Date of Birth: April 21, 1941  Subjective/Objective:          Spoke with daughter Cheyenne Montes who states that she has HHA with [facility] set up to start tomorrow at 4pm. Dr Broadus John made aware that DC will need to be early, if possible, as Cheyenne Montes needs to notify HHA by 12:00. DR Broadus John agreeable to rounding early, and anticipates DC in AM. Cheyenne Montes updated and told to call unit by 11:00 if she has not been notified of DC. Cheyenne Montes would like [facility] for [facility] services. Referral made to [facility] for [facility] RN PT OT HHA SW. Needs orders. No DME anticipated. Cheyenne Montes states she will provide transport home.  Cheyenne Montes [phone]  Action/Plan:  Needs HH orders. Cheyenne Montes needs notification of DC by 12:00.     Expected Discharge Date:                  Expected Discharge Plan:  [place]  In-House Referral:     Discharge planning Services  CM Consult  Post Acute Care Choice:    Choice offered to:     DME Arranged:    DME Agency:     HH Arranged:  RN, PT, OT, Nurse's Aide, Social Work [employer] Agency:  [place]  Status of Service:  Completed, signed off  If discussed at [employer], dates discussed:    Additional Comments:  Carles Collet, RN 01/15/2018, 3:28 PM

## 2018-01-15 NOTE — Progress Notes (Signed)
PROGRESS NOTE    Cheyenne Montes  UJW:[id number] DOB: June 08, 1941 DOA: 01/13/2018 PCP: Patient, No Pcp Per  Brief Narrative:  Cheyenne Montes is a 77 year old female with hypothyroidism and ongoing memory, cognitive decline and confusion for more than one year. -her husband who cares for her is currently hospitalized in [place]. And patient was noted to be belligerent   Assessment & Plan:   Progressive cognitive decline, memory loss, confusion -symptoms for >1year, with cared for by spouse at home -Suspect undiagnosed dementia -TSH level is ok and B-12 level is WNL -plan for increased assistance and supervision at DC  Pyuria/possible UTI -Continue ceftriaxone, follow up urine cultures-pending, person was and urine culture was not ordered on admission, I have reordered this also likely due low yield now  Small chronic subarachnoid hemorrhage -Incidentally noted on imaging -MRI noted, chronic changes evident -This was discussed with neurology Dr.Arora and Neurosurgery Dr.Stern last pm on admission, no further workup recommended at this time and felt to be unlikely to be contributing to current presentation  Hypothyroidism -Continue Synthroid,  TSH ok  DVT prophylaxis: SCDs Code Status: FUll Code Family Communication: no family at bedside, called and discussed with spouse and daughter yesterday Disposition Plan: home tomorrow with home health services and supervision  Consultants:   Discussed with neurosurgery Dr. Vertell Limber and neurology Dr.Arora   Procedures:   Antimicrobials:    Subjective: -no complaints, feels well, pleasantly confused  Objective: Vitals:   01/14/18 2351 01/15/18 0349 01/15/18 0824 01/15/18 1234  BP: (!) 129/55 130/61 124/69 127/60  Pulse: (!) 55 73 91 80  Resp: 18 18 16 20   Temp: 98.1 F (36.7 C) 98.2 F (36.8 C) 97.8 F (36.6 C) 98.4 F (36.9 C)  TempSrc: Oral Oral  Oral  SpO2: 98% 97% 94% 99%    Intake/Output Summary (Last 24 hours) at 01/15/2018  1254 Last data filed at 01/14/2018 2254 Gross per 24 hour  Intake 120 ml  Output -  Net 120 ml   There were no vitals filed for this visit.  Examination:  General exam: Appears calm and comfortable, AAOx1, confused, pleasant Respiratory system: Clear to auscultation. Respiratory effort normal. Cardiovascular system: S1 & S2 heard, RRR.  Gastrointestinal system: Abdomen is nondistended, soft and nontender.Normal bowel sounds heard. Central nervous system:No focal neurological deficits. Extremities: Symmetric 5 x 5 power. Skin: No rashes, lesions or ulcers Psychiatry: Mood & affect appropriate.     Data Reviewed:   CBC: Recent Labs  Lab 01/13/18 2020 01/14/18 0727  WBC 7.9 6.5  NEUTROABS 5.1 4.1  HGB 14.3 13.6  HCT 46.8* 42.9  MCV 94.0 90.9  PLT 216 562   Basic Metabolic Panel: Recent Labs  Lab 01/13/18 2020 01/14/18 0727  NA 140 140  K 3.9 3.5  CL 107 106  CO2 23 26  GLUCOSE 91 96  BUN 22 16  CREATININE 0.82 0.69  CALCIUM 10.3 9.9   GFR: CrCl cannot be calculated (Unknown ideal weight.). Liver Function Tests: Recent Labs  Lab 01/13/18 2020 01/14/18 0727  AST 17 21  ALT 17 19  ALKPHOS 84 88  BILITOT 0.5 0.8  PROT 7.5 7.3  ALBUMIN 4.1 3.8   No results for input(s): LIPASE, AMYLASE in the last 168 hours. Recent Labs  Lab 01/14/18 0727  AMMONIA 30   Coagulation Profile: No results for input(s): INR, PROTIME in the last 168 hours. Cardiac Enzymes: No results for input(s): CKTOTAL, CKMB, CKMBINDEX, TROPONINI in the last 168 hours. BNP (last 3  results) No results for input(s): PROBNP in the last 8760 hours. HbA1C: No results for input(s): HGBA1C in the last 72 hours. CBG: No results for input(s): GLUCAP in the last 168 hours. Lipid Profile: No results for input(s): CHOL, HDL, LDLCALC, TRIG, CHOLHDL, LDLDIRECT in the last 72 hours. Thyroid Function Tests: Recent Labs    01/13/18 2020  TSH 0.445   Anemia Panel: Recent Labs     01/14/18 0727  VITAMINB12 519   Urine analysis:    Component Value Date/Time   COLORURINE YELLOW 01/14/2018 0128   APPEARANCEUR HAZY (A) 01/14/2018 0128   LABSPEC 1.016 01/14/2018 0128   PHURINE 6.0 01/14/2018 0128   GLUCOSEU NEGATIVE 01/14/2018 0128   HGBUR SMALL (A) 01/14/2018 0128   BILIRUBINUR NEGATIVE 01/14/2018 0128   KETONESUR 20 (A) 01/14/2018 0128   PROTEINUR NEGATIVE 01/14/2018 0128   NITRITE POSITIVE (A) 01/14/2018 0128   LEUKOCYTESUR NEGATIVE 01/14/2018 0128   Sepsis Labs: @LABRCNTIP (procalcitonin:4,lacticidven:4)  )No results found for this or any previous visit (from the past 240 hour(s)).       Radiology Studies: Ct Head Wo Contrast  Result Date: 01/13/2018 CLINICAL DATA:  Altered mental status EXAM: CT HEAD WITHOUT CONTRAST TECHNIQUE: Contiguous axial images were obtained from the base of the skull through the vertex without intravenous contrast. COMPARISON:  None. FINDINGS: Brain: There is a small focus of hyperattenuation in the inferior right cerebellum (axial image 6, sagittal image 26). There is generalized atrophy without lobar predilection. There is an old posterior right MCA territory infarct with associated encephalomalacia. There is hypoattenuation of the periventricular white matter, most commonly indicating chronic ischemic microangiopathy. Vascular: Atherosclerotic calcification of the internal carotid arteries at the skull base. No abnormal hyperdensity of the major intracranial arteries or dural venous sinuses. Skull: The visualized skull base, calvarium and extracranial soft tissues are normal. Sinuses/Orbits: No fluid levels or advanced mucosal thickening of the visualized paranasal sinuses. No mastoid or middle ear effusion. The orbits are normal. IMPRESSION: 1. Small focus of suspected acute to subacute subarachnoid hemorrhage in the right cerebellum. No associated mass effect. This may be posttraumatic, but MRI might be helpful to exclude an  associated lesion, such as a cavernoma. 2. 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 Critical Value/emergent results were called by telephone at the time of interpretation on 01/13/2018 at 8:41 pm to Dr. Daleen Bo , who verbally acknowledged these results. Electronically Signed   By: Ulyses Jarred M.D.   On: 01/13/2018 20:52   Mr Brain Wo Contrast  Addendum Date: 01/14/2018   ADDENDUM REPORT: 01/14/2018 02:27 ADDENDUM: Subarachnoid hemorrhage within the right inferior cerebellar hemisphere demonstrates low signal on all sequences compatible with chronic hemorrhage. Increased density on CT is likely mineralization associated with the chronic hemorrhage. No edema or mass effect. These findings were discussed on 01/14/2018 at 2:25 am with Dr. Rory Percy. Electronically Signed   By: Kristine Garbe M.D.   On: 01/14/2018 02:27   Result Date: 01/14/2018 CLINICAL DATA:  77 y/o F; stroke for follow-up. Altered mental status. EXAM: MRI HEAD WITHOUT CONTRAST TECHNIQUE: Multiplanar, multiecho pulse sequences of the brain and surrounding structures were obtained without intravenous contrast. COMPARISON:  01/13/2018 CT head FINDINGS: Brain: No acute infarction, hydrocephalus, extra-axial collection or mass lesion. Chronic right posterior MCA distribution infarction involving the lateral temporal and parietal lobes. Multiple small chronic infarcts within the bilateral cerebellar hemispheres. Ex vacuo dilatation of the temporal horn of right lateral ventricle. Patchy nonspecific foci of T2 FLAIR hyperintense signal  abnormality in subcortical and periventricular white matter are compatible with moderate chronic microvascular ischemic changes for age. Moderate brain parenchymal volume loss. There is hemosiderin staining of a infarct in the right posterior temporal lobe area of infarction. Small volume of subarachnoid hemorrhage overlying the right inferior cerebellar hemisphere.  Vascular: Normal flow voids. Skull and upper cervical spine: Normal marrow signal. Sinuses/Orbits: Small mucous retention cyst in the right maxillary sinus. Right globe intra-ocular lens replacement. Other: None. IMPRESSION: 1. No acute/early subacute infarct, focal mass effect, herniation. 2. Stable small focus of subarachnoid hemorrhage overlying right inferior cerebellum given differences in technique. 3. Chronic right posterior MCA distribution infarction and multiple small chronic infarctions within the bilateral cerebellar hemispheres. 4. Moderate for age chronic microvascular ischemic changes and parenchymal volume loss of the brain. Electronically Signed: By: Kristine Garbe M.D. On: 01/13/2018 22:25        Scheduled Meds: . levothyroxine  37.5 mcg Oral Daily   Continuous Infusions: . cefTRIAXone (ROCEPHIN)  IV Stopped (01/14/18 2254)     LOS: 0 days    Time spent: 56min    Domenic Polite, MD [facility] Page via [email or web address], password [email or web address] After 7PM please contact night-coverage  01/15/2018, 12:54 PM

## 2018-01-16 MED ORDER — CEPHALEXIN 250 MG PO CAPS: 250.0000 mg | ORAL_CAPSULE | Freq: Three times a day (TID) | ORAL | 0 refills | Status: AC

## 2018-01-16 MED ORDER — ACETAMINOPHEN 325 MG PO TABS: 650.0000 mg | ORAL_TABLET | Freq: Four times a day (QID) | ORAL | Status: AC | PRN

## 2018-01-16 NOTE — Discharge Summary (Signed)
Physician Discharge Summary  Cheyenne Montes:[id number] DOB: 03/06/1941 DOA: 01/13/2018  PCP: Patient, No Pcp Per  Admit date: 01/13/2018 Discharge date: 01/16/2018  Time spent: 45 minutes  Recommendations for Outpatient Follow-up:  1. PCP in 1 week 2. [facility] in 1 month   Discharge Diagnoses:  Principal Problem:   Acute encephalopathy   Memory loss and cognitive dysfunction, dementia suspected   Delirium   Hypothyroidism   Chronic SAH (subarachnoid hemorrhage) ([place])   Possible  UTI   Discharge Condition: stable  Diet recommendation:  Heart healthy  There were no vitals filed for this visit.  History of present illness:  [name] is a 77 year old female with hypothyroidism and ongoing memory, cognitive decline and confusion for more than one year. -her husband who cares for her is currently hospitalized in [place]. And patient was noted to be confused and belligerent around him. -subsequently brought by her children to the emergency room  Hospital Course:   Progressive cognitive decline, memory loss, confusion -symptoms for >1year, with cared for by spouse at home -Suspect undiagnosed dementia -TSH level is ok and B-12 level is WNL -plan for increased assistance and supervision at DC -referral sent to [facility] neurology for follow-up -PT OT evaluation completed, supervision recommended, did not qualify for Medicare to cover SNF,  Seen by social worker and case manager, decision made for home health services and private caregivers per family at discharge  Pyuria/possible UTI -Urine cultures-still pending -status post 2 days of IV ceftriaxone, afebrile, without leukocytosis, transitioned to Keflex 1 day of discharge  Small chronic subarachnoid hemorrhage -Incidentally noted on imaging -MRI noted, chronic changes of small subarachnoid hemorrhage  -This was discussed with neurology Dr.Arora and Neurosurgery Dr.Stern 2days ago on admission, no further  workup recommended at this time and felt to be unlikely to be contributing to current presentation -follow-up with neurology  Hypothyroidism -Continue Synthroid,  TSH ok    Discharge Exam: Vitals:   01/16/18 0406 01/16/18 1152  BP: 131/61 139/61  Pulse: 79 91  Resp: 18 16  Temp: 98.6 F (37 C)   SpO2: 96% 98%    General: alert awake, oriented to self only Cardiovascular: S1-S2/regular rate rhythm Respiratory: clear bilaterally  Discharge Instructions   Discharge Instructions    Ambulatory referral to Neurology   Complete by:  As directed    An appointment is requested in approximately: 2-3weeks   Diet - low sodium heart healthy   Complete by:  As directed    Increase activity slowly   Complete by:  As directed      Allergies as of 01/16/2018   No Known Allergies     Medication List    TAKE these medications   acetaminophen 325 MG tablet Commonly known as:  TYLENOL Take 2 tablets (650 mg total) by mouth every 6 (six) hours as needed for mild pain (or Fever >/= 101).   cephALEXin 250 MG capsule Commonly known as:  KEFLEX Take 1 capsule (250 mg total) by mouth 3 (three) times daily for 1 day. For 1day   levothyroxine 75 MCG tablet Commonly known as:  SYNTHROID, LEVOTHROID Take 37.5 mcg by mouth daily.      No Known Allergies Follow-up Information    [facility] Follow up in 2 week(s).   Specialty:  Neurology Why:  referral sent, they should call you for follow up, please call if you dont hear from office in 3 business days Contact information: [address]  [place] [phone]       PCP. Schedule an appointment as soon as possible for a visit in 1 week(s).            The results of significant diagnostics from this hospitalization (including imaging, microbiology, ancillary and laboratory) are listed below for reference.    Significant Diagnostic Studies: Ct Head Wo Contrast  Result  Date: 01/13/2018 CLINICAL DATA:  Altered mental status EXAM: CT HEAD WITHOUT CONTRAST TECHNIQUE: Contiguous axial images were obtained from the base of the skull through the vertex without intravenous contrast. COMPARISON:  None. FINDINGS: Brain: There is a small focus of hyperattenuation in the inferior right cerebellum (axial image 6, sagittal image 26). There is generalized atrophy without lobar predilection. There is an old posterior right MCA territory infarct with associated encephalomalacia. There is hypoattenuation of the periventricular white matter, most commonly indicating chronic ischemic microangiopathy. Vascular: Atherosclerotic calcification of the internal carotid arteries at the skull base. No abnormal hyperdensity of the major intracranial arteries or dural venous sinuses. Skull: The visualized skull base, calvarium and extracranial soft tissues are normal. Sinuses/Orbits: No fluid levels or advanced mucosal thickening of the visualized paranasal sinuses. No mastoid or middle ear effusion. The orbits are normal. IMPRESSION: 1. Small focus of suspected acute to subacute subarachnoid hemorrhage in the right cerebellum. No associated mass effect. This may be posttraumatic, but MRI might be helpful to exclude an associated lesion, such as a cavernoma. 2. 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 Critical Value/emergent results were called by telephone at the time of interpretation on 01/13/2018 at 8:41 pm to Dr. Daleen Bo , who verbally acknowledged these results. Electronically Signed   By: Ulyses Jarred M.D.   On: 01/13/2018 20:52   Mr Brain Wo Contrast  Addendum Date: 01/14/2018   ADDENDUM REPORT: 01/14/2018 02:27 ADDENDUM: Subarachnoid hemorrhage within the right inferior cerebellar hemisphere demonstrates low signal on all sequences compatible with chronic hemorrhage. Increased density on CT is likely mineralization associated with the chronic hemorrhage.  No edema or mass effect. These findings were discussed on 01/14/2018 at 2:25 am with Dr. Rory Percy. Electronically Signed   By: Kristine Garbe M.D.   On: 01/14/2018 02:27   Result Date: 01/14/2018 CLINICAL DATA:  77 y/o F; stroke for follow-up. Altered mental status. EXAM: MRI HEAD WITHOUT CONTRAST TECHNIQUE: Multiplanar, multiecho pulse sequences of the brain and surrounding structures were obtained without intravenous contrast. COMPARISON:  01/13/2018 CT head FINDINGS: Brain: No acute infarction, hydrocephalus, extra-axial collection or mass lesion. Chronic right posterior MCA distribution infarction involving the lateral temporal and parietal lobes. Multiple small chronic infarcts within the bilateral cerebellar hemispheres. Ex vacuo dilatation of the temporal horn of right lateral ventricle. Patchy nonspecific foci of T2 FLAIR hyperintense signal abnormality in subcortical and periventricular white matter are compatible with moderate chronic microvascular ischemic changes for age. Moderate brain parenchymal volume loss. There is hemosiderin staining of a infarct in the right posterior temporal lobe area of infarction. Small volume of subarachnoid hemorrhage overlying the right inferior cerebellar hemisphere. Vascular: Normal flow voids. Skull and upper cervical spine: Normal marrow signal. Sinuses/Orbits: Small mucous retention cyst in the right maxillary sinus. Right globe intra-ocular lens replacement. Other: None. IMPRESSION: 1. No acute/early subacute infarct, focal mass effect, herniation. 2. Stable small focus of subarachnoid hemorrhage overlying right inferior cerebellum given differences in technique. 3. Chronic right posterior MCA distribution infarction and multiple small chronic infarctions within the bilateral cerebellar hemispheres. 4. Moderate for age  chronic microvascular ischemic changes and parenchymal volume loss of the brain. Electronically Signed: By: Kristine Garbe M.D. On:  01/13/2018 22:25    Microbiology: Recent Results (from the past 240 hour(s))  Culture, Urine     Status: Abnormal (Preliminary result)   Collection Time: 01/14/18  1:28 AM  Result Value Ref Range Status   Specimen Description URINE, RANDOM  Final   Special Requests   Final    NONE Performed at [facility], [address] [place], [place] [address]    Culture >=100,000 COLONIES/mL GRAM NEGATIVE RODS (A)  Final   Report Status PENDING  Incomplete     Labs: Basic Metabolic Panel: Recent Labs  Lab 01/13/18 2020 01/14/18 0727  NA 140 140  K 3.9 3.5  CL 107 106  CO2 23 26  GLUCOSE 91 96  BUN 22 16  CREATININE 0.82 0.69  CALCIUM 10.3 9.9   Liver Function Tests: Recent Labs  Lab 01/13/18 2020 01/14/18 0727  AST 17 21  ALT 17 19  ALKPHOS 84 88  BILITOT 0.5 0.8  PROT 7.5 7.3  ALBUMIN 4.1 3.8   No results for input(s): LIPASE, AMYLASE in the last 168 hours. Recent Labs  Lab 01/14/18 0727  AMMONIA 30   CBC: Recent Labs  Lab 01/13/18 2020 01/14/18 0727  WBC 7.9 6.5  NEUTROABS 5.1 4.1  HGB 14.3 13.6  HCT 46.8* 42.9  MCV 94.0 90.9  PLT 216 243   Cardiac Enzymes: No results for input(s): CKTOTAL, CKMB, CKMBINDEX, TROPONINI in the last 168 hours. BNP: BNP (last 3 results) No results for input(s): BNP in the last 8760 hours.  ProBNP (last 3 results) No results for input(s): PROBNP in the last 8760 hours.  CBG: No results for input(s): GLUCAP in the last 168 hours.     Signed:  Domenic Polite MD.  [facility] 01/16/2018, 12:38 PM

## 2018-01-17 LAB — FOLATE RBC
Folate, Hemolysate: 580.3 ng/mL
Folate, RBC: 1412 ng/mL (ref >498)
HEMATOCRIT: 41.1 % (ref 34.0–46.6)

## 2018-01-25 LAB — URINE CULTURE: Culture: >=100000 — AB

## 2018-01-25 LAB — SUSCEPTIBILITY RESULT

## 2018-01-25 LAB — SUSCEPTIBILITY, AER + ANAEROB

## 2018-02-12 ENCOUNTER — Emergency Department (HOSPITAL_COMMUNITY): Payer: Medicare Other

## 2018-02-12 ENCOUNTER — Emergency Department (HOSPITAL_COMMUNITY)
Admission: EM | Admit: 2018-02-12 | Discharge: 2018-02-12 | Disposition: A | Discharge status: Skilled Nursing Facility | Payer: Medicare Other | Attending: Emergency Medicine | Admitting: Emergency Medicine

## 2018-02-12 ENCOUNTER — Encounter (HOSPITAL_COMMUNITY): Payer: Self-pay

## 2018-02-12 DIAGNOSIS — Z853 Personal history of malignant neoplasm of breast: Secondary | ICD-10-CM | POA: Diagnosis not present

## 2018-02-12 DIAGNOSIS — E039 Hypothyroidism, unspecified: Secondary | ICD-10-CM | POA: Insufficient documentation

## 2018-02-12 DIAGNOSIS — J189 Pneumonia, unspecified organism: Secondary | ICD-10-CM | POA: Diagnosis not present

## 2018-02-12 DIAGNOSIS — Z79899 Other long term (current) drug therapy: Secondary | ICD-10-CM | POA: Insufficient documentation

## 2018-02-12 DIAGNOSIS — Z7982 Long term (current) use of aspirin: Secondary | ICD-10-CM | POA: Insufficient documentation

## 2018-02-12 DIAGNOSIS — J181 Lobar pneumonia, unspecified organism: Secondary | ICD-10-CM

## 2018-02-12 DIAGNOSIS — R509 Fever, unspecified: Secondary | ICD-10-CM | POA: Diagnosis present

## 2018-02-12 LAB — CBC WITH DIFFERENTIAL/PLATELET
BASOS ABS: 0 10*3/uL (ref 0.0–0.1)
BASOS PCT: 0 %
Eosinophils Absolute: 0 10*3/uL (ref 0.0–0.7)
Eosinophils Relative: 0 %
HEMATOCRIT: 42.2 % (ref 36.0–46.0)
HEMOGLOBIN: 13.6 g/dL (ref 12.0–15.0)
Lymphocytes Relative: 11 %
Lymphs Abs: 0.9 10*3/uL (ref 0.7–4.0)
MCH: 29.5 pg (ref 26.0–34.0)
MCHC: 32.2 g/dL (ref 30.0–36.0)
MCV: 91.5 fL (ref 78.0–100.0)
Monocytes Absolute: 1.7 10*3/uL — ABNORMAL HIGH (ref 0.1–1.0)
Monocytes Relative: 23 %
NEUTROS ABS: 5 10*3/uL (ref 1.7–7.7)
NEUTROS PCT: 66 %
Platelets: 207 10*3/uL (ref 150–400)
RBC: 4.61 MIL/uL (ref 3.87–5.11)
RDW: 14.8 % (ref 11.5–15.5)
WBC: 7.7 10*3/uL (ref 4.0–10.5)

## 2018-02-12 LAB — I-STAT CG4 LACTIC ACID, ED
LACTIC ACID, VENOUS: 1.36 mmol/L (ref 0.5–1.9)
Lactic Acid, Venous: 1.05 mmol/L (ref 0.5–1.9)

## 2018-02-12 LAB — COMPREHENSIVE METABOLIC PANEL
ALK PHOS: 91 U/L (ref 38–126)
ALT: 14 U/L (ref 0–44)
ANION GAP: 9 (ref 5–15)
AST: 14 U/L — ABNORMAL LOW (ref 15–41)
Albumin: 3.6 g/dL (ref 3.5–5.0)
BILIRUBIN TOTAL: 1 mg/dL (ref 0.3–1.2)
BUN: 16 mg/dL (ref 8–23)
CALCIUM: 10.1 mg/dL (ref 8.9–10.3)
CO2: 29 mmol/L (ref 22–32)
Chloride: 103 mmol/L (ref 98–111)
Creatinine, Ser: 0.67 mg/dL (ref 0.44–1.00)
Glucose, Bld: 121 mg/dL — ABNORMAL HIGH (ref 70–99)
Potassium: 3.7 mmol/L (ref 3.5–5.1)
Sodium: 141 mmol/L (ref 135–145)
TOTAL PROTEIN: 7.7 g/dL (ref 6.5–8.1)

## 2018-02-12 MED ORDER — SODIUM CHLORIDE 0.9 % IV SOLN
2.0000 g | INTRAVENOUS | Status: DC
  Administered 2018-02-12: 2 g via INTRAVENOUS
  Filled 2018-02-12: qty 20

## 2018-02-12 MED ORDER — AMOXICILLIN-POT CLAVULANATE 875-125 MG PO TABS: 1.0000 | ORAL_TABLET | Freq: Two times a day (BID) | ORAL | 0 refills | Status: AC

## 2018-02-12 MED ORDER — SODIUM CHLORIDE 0.9 % IV BOLUS (SEPSIS)
500.0000 mL | Freq: Once | INTRAVENOUS | Status: AC
  Administered 2018-02-12: 500 mL via INTRAVENOUS

## 2018-02-12 MED ORDER — AZITHROMYCIN 500 MG IV SOLR
500.0000 mg | INTRAVENOUS | Status: DC
  Administered 2018-02-12: 500 mg via INTRAVENOUS
  Filled 2018-02-12: qty 500

## 2018-02-12 MED ORDER — SODIUM CHLORIDE 0.9 % IV BOLUS (SEPSIS)
1000.0000 mL | Freq: Once | INTRAVENOUS | Status: AC
  Administered 2018-02-12: 1000 mL via INTRAVENOUS

## 2018-02-12 MED ORDER — SODIUM CHLORIDE 0.9 % IV BOLUS (SEPSIS)
250.0000 mL | Freq: Once | INTRAVENOUS | Status: AC
  Administered 2018-02-12: 250 mL via INTRAVENOUS

## 2018-02-12 NOTE — ED Notes (Signed)
Both cultures taken before antibiotics given.

## 2018-02-12 NOTE — ED Notes (Signed)
Patient not answering neuro questions. Patients states, "Leave, just go, get out of here".

## 2018-02-12 NOTE — ED Notes (Signed)
Patient has remained at 93% on room air. Wentz,MD made aware.

## 2018-02-12 NOTE — ED Notes (Signed)
Face sheet and med nec. In patients slot at nurses station.

## 2018-02-12 NOTE — ED Notes (Signed)
Attempted to call and give report to Durenda Age. No answer. Left a voicemail.

## 2018-02-12 NOTE — ED Notes (Signed)
Wentz,MD made aware of patient rectal temperature.

## 2018-02-12 NOTE — ED Notes (Signed)
Patient stating she wants to go home. Patient has followed commands but is very irritated.

## 2018-02-12 NOTE — Discharge Instructions (Addendum)
Make sure that she is getting plenty of rest and drinking a lot of fluids.  Treat cough using Robitussin-DM if needed.  Encourage her to eat regularly.  Follow-up with primary care service for a checkup in 3 or 4 days, sooner if needed for problems.

## 2018-02-12 NOTE — ED Notes (Signed)
Bed: JI96 Expected date:  Expected time:  Means of arrival:  Comments: 77 yo cough, fever

## 2018-02-12 NOTE — ED Provider Notes (Addendum)
[facility] DE[facility] Provider Note   CSN: [id number] Arrival date & time: 02/12/18  0827     History   Chief Complaint Chief Complaint  Patient presents with  . Cough  . Fever    HPI Cheyenne Montes is a 77 y.o. female.  HPI  She presents for evaluation of reported fever, with cough.  Patient is unaware why she is here and repeatedly states "I want to go home."  Level 5 caveat   Past Medical History:  Diagnosis Date  . Cancer [facility])    breast    Patient Active Problem List   Diagnosis Date Noted  . Acute encephalopathy 01/14/2018  . Delirium 01/14/2018  . Hypothyroidism 01/14/2018  . SAH (subarachnoid hemorrhage) ([place]) 01/14/2018  . Acute lower UTI 01/14/2018    History reviewed. No pertinent surgical history.   OB History   None      Home Medications    Prior to Admission medications   Medication Sig Start Date End Date Taking? Authorizing Provider  aspirin EC 81 MG tablet Take 81 mg by mouth daily.   Yes [provider]  calcium carbonate (TUMS - DOSED IN MG ELEMENTAL CALCIUM) 500 MG chewable tablet Chew 1 tablet by mouth daily.   Yes [provider]  cholecalciferol (VITAMIN D) 1000 units tablet Take 2,000 Units by mouth daily.   Yes [provider]  levothyroxine (SYNTHROID, LEVOTHROID) 75 MCG tablet Take 37.5 mcg by mouth daily. 01/03/18  Yes [provider]  acetaminophen (TYLENOL) 325 MG tablet Take 2 tablets (650 mg total) by mouth every 6 (six) hours as needed for mild pain (or Fever >/= 101). Patient not taking: Reported on 02/12/2018 01/16/18   Domenic Polite, MD  amoxicillin-clavulanate (AUGMENTIN) 875-125 MG tablet Take 1 tablet by mouth 2 (two) times daily. One po bid x 7 days 02/12/18   Daleen Bo, MD    Family History Family History  Problem Relation Age of Onset  . Diabetes Mellitus II Neg Hx   . CAD Neg Hx     Social History Social History   Tobacco Use  .  Smoking status: Never Smoker  . Smokeless tobacco: Never Used  Substance Use Topics  . Alcohol use: Not on file  . Drug use: Not on file     Allergies   Patient has no known allergies.   Review of Systems Review of Systems  Unable to perform ROS: Dementia     Physical Exam Updated Vital Signs BP 126/62 (BP Location: Left Arm)   Pulse 99   Temp (!) 100.6 F (38.1 C) (Rectal)   Resp 17   Wt 54.3 kg (119 lb 11.2 oz)   SpO2 91%   Physical Exam  Constitutional: She appears well-developed.  Elderly, frail  HENT:  Head: Normocephalic and atraumatic.  Eyes: Pupils are equal, round, and reactive to light. Conjunctivae and EOM are normal.  Neck: Normal range of motion and phonation normal. Neck supple.  Cardiovascular: Normal rate and regular rhythm.  Pulmonary/Chest: Effort normal. No respiratory distress. She has no wheezes. She exhibits no tenderness.  There is no increased work of breathing.  Scattered diffuse rhonchi.  Abdominal: Soft. She exhibits no distension. There is no tenderness. There is no guarding.  Musculoskeletal: Normal range of motion. She exhibits no edema, tenderness or deformity.  Neurological: She is alert. No cranial nerve deficit. She exhibits normal muscle tone.  Skin: Skin is warm and dry.  Psychiatric: She has a normal  mood and affect. Her behavior is normal.  Nursing note and vitals reviewed.    ED Treatments / Results  Labs (all labs ordered are listed, but only abnormal results are displayed) Labs Reviewed  COMPREHENSIVE METABOLIC PANEL - Abnormal; Notable for the following components:      Result Value   Glucose, Bld 121 (*)    AST 14 (*)    All other components within normal limits  CBC WITH DIFFERENTIAL/PLATELET - Abnormal; Notable for the following components:   Monocytes Absolute 1.7 (*)    All other components within normal limits  CULTURE, BLOOD (ROUTINE X 2)  CULTURE, BLOOD (ROUTINE X 2)  I-STAT CG4 LACTIC ACID, ED  I-STAT CG4  LACTIC ACID, ED    EKG None  Radiology Dg Chest 2 View  Result Date: 02/12/2018 CLINICAL DATA:  Fevers EXAM: CHEST - 2 VIEW COMPARISON:  None. FINDINGS: Cardiac shadow is within normal limits. The lungs are well aerated bilaterally. Some patchy changes are noted in the right upper lobe which may represent some early infiltrate. No sizable effusion is seen. Minimal bibasilar atelectasis is noted. No acute bony abnormality is seen. IMPRESSION: Mild bibasilar atelectasis and patchy changes in the right upper lobe which likely represents early infiltrate. Electronically Signed   By: Inez Catalina M.D.   On: 02/12/2018 09:29    Procedures .Critical Care Performed by: Daleen Bo, MD Authorized by: Daleen Bo, MD   Critical care provider statement:    Critical care time (minutes):  35   Critical care start time:  02/12/2018 9:00 AM   Critical care end time:  02/12/2018 4:44 PM   Critical care time was exclusive of:  Separately billable procedures and treating other patients   Critical care was necessary to treat or prevent imminent or life-threatening deterioration of the following conditions:  Respiratory failure   Critical care was time spent personally by me on the following activities:  Blood draw for specimens, development of treatment plan with patient or surrogate, discussions with consultants, evaluation of patient's response to treatment, examination of patient, obtaining history from patient or surrogate, ordering and performing treatments and interventions, ordering and review of laboratory studies, pulse oximetry, re-evaluation of patient's condition, review of old charts and ordering and review of radiographic studies   (including critical care time)  Medications Ordered in ED Medications  cefTRIAXone (ROCEPHIN) 2 g in sodium chloride 0.9 % 100 mL IVPB (0 g Intravenous Stopped 02/12/18 1108)  azithromycin (ZITHROMAX) 500 mg in sodium chloride 0.9 % 250 mL IVPB (0 mg  Intravenous Stopped 02/12/18 1135)  sodium chloride 0.9 % bolus 1,000 mL (0 mLs Intravenous Stopped 02/12/18 1135)    And  sodium chloride 0.9 % bolus 500 mL (0 mLs Intravenous Stopped 02/12/18 1135)    And  sodium chloride 0.9 % bolus 250 mL (0 mLs Intravenous Stopped 02/12/18 1107)     Initial Impression / Assessment and Plan / ED Course  I have reviewed the triage vital signs and the nursing notes.  Pertinent labs & imaging results that were available during my care of the patient were reviewed by me and considered in my medical decision making (see chart for details).  Clinical Course as of Feb 12 1645  Sat Feb 12, 2018  1119 Normal except elevated glucose  Comprehensive metabolic panel(!) [EW]  9924 Normal  CBC WITH DIFFERENTIAL(!) [EW]  1119 normal  I-Stat CG4 Lactic Acid, ED  (not at  [facility]) [EW]  1123 Vague right upper lobe infiltrate,  with bilateral basal atelectasis.  Images reviewed by me.  DG Chest 2 View [EW]    Clinical Course User Index [EW] Daleen Bo, MD     Patient Vitals for the past 24 hrs:  BP Temp Temp src Pulse Resp SpO2 Weight  02/12/18 1630 126/62 - - 99 17 91 % -  02/12/18 1621 - - - 93 - 93 % -  02/12/18 1600 (!) 138/58 - - (!) 103 18 93 % -  02/12/18 1530 (!) 139/58 - - (!) 101 18 97 % -  02/12/18 1500 138/70 - - (!) 111 17 98 % -  02/12/18 1400 125/60 - - 100 18 95 % -  02/12/18 1330 (!) 138/59 - - (!) 102 18 96 % -  02/12/18 1315 - - - (!) 102 - 96 % -  02/12/18 1230 129/74 - - (!) 103 18 96 % -  02/12/18 1200 (!) 150/81 - - (!) 111 18 97 % -  02/12/18 1131 (!) 138/101 - - (!) 116 20 95 % -  02/12/18 1100 129/64 - - (!) 103 18 99 % -  02/12/18 1033 (!) 125/57 - - 99 18 96 % -  02/12/18 0953 - - - - - - 54.3 kg (119 lb 11.2 oz)  02/12/18 0932 - (!) 100.6 F (38.1 C) Rectal - - - -  02/12/18 0931 (!) 150/77 - - - - - -  02/12/18 0925 (!) 150/96 - - (!) 105 19 95 % -  02/12/18 0837 109/74 98.3 F (36.8 C) Oral (!) 107 (!) 22 97 % -    02/12/18 0837 109/74 - - (!) 105 - 96 % -  02/12/18 0829 - - - - - 95 % -    4:43 PM Reevaluation with update and discussion. After initial assessment and treatment, an updated evaluation reveals patient is clinically improved with decreased heart rate to normal and oxygenation normal on room air.  She is stable for discharge to home. Daleen Bo   Medical Decision Making: Nontoxic patient presenting for evaluation of cough with congestion.  Reassuring evaluation in the ED. Suspect community-acquired pneumonia, mild without severe injury.  Doubt serious bacterial infection, sepsis or impending vascular collapse.  Patient stable for outpatient treatment.  She has a very low risk for decompensation.  CRITICAL CARE-yes Performed by: Daleen Bo    Final Clinical Impressions(s) / ED Diagnoses   Final diagnoses:  Community acquired pneumonia of right upper lobe of lung [facility])    ED Discharge Orders        Ordered    amoxicillin-clavulanate (AUGMENTIN) 875-125 MG tablet  2 times daily     02/12/18 1642       Daleen Bo, MD 02/12/18 1644    Daleen Bo, MD 02/12/18 Jamal Maes    Daleen Bo, MD 02/21/18 [phone]

## 2018-02-12 NOTE — ED Notes (Signed)
[facility] has been called. Per [facility] they have a long list.

## 2018-02-12 NOTE — ED Notes (Signed)
At this time patient is pleasant,cooperative, and following commands.

## 2018-02-12 NOTE — ED Notes (Signed)
Spoke with [place] facility and updated them about patient.

## 2018-02-12 NOTE — ED Notes (Addendum)
Patient transported to X-ray. Will try for rectal temperature when patients is back.

## 2018-02-12 NOTE — ED Triage Notes (Addendum)
Patient arrived via [facility] from [place] [facility] care unit ([id number]). Staff stated about 4 days ago patient started to have a sore throat and "junky lobes". Yesterday patient developed a low grade fever. Per ems patient is "junky in all lobes". Facility nurse called doctor and family and can not reach anyone. Patient insist she is fine. Hx. Alzheimer's Disease.

## 2018-02-12 NOTE — ED Notes (Addendum)
Water, crackers, and sandwich at bedside. Per Wentz,MD patient can eat.

## 2018-02-12 NOTE — ED Notes (Signed)
Patient unable to sign for self due to mental status

## 2018-02-17 LAB — CULTURE, BLOOD (ROUTINE X 2)
CULTURE: NO GROWTH
Culture: NO GROWTH
SPECIAL REQUESTS: ADEQUATE

## 2018-03-23 ENCOUNTER — Ambulatory Visit: Payer: Medicare Other | Admitting: Neurology

## 2020-01-18 DEATH — deceased

## 2020-01-22 IMAGING — CT CT HEAD W/O CM
3 of 4 series · 15 of 47 positions shown, 18 images · non-contrast
Comparison: None.

CLINICAL DATA: Altered mental status

EXAM:
CT HEAD WITHOUT CONTRAST
TECHNIQUE: Contiguous axial images were obtained from the base of the skull
through the vertex without intravenous contrast.

[Series 5: head 2.0 h70h · axial · 0.41mm/px · z∈[-107,+37]mm · 9 of 90 slices shown, 12 images]
[im 9/90  brain]
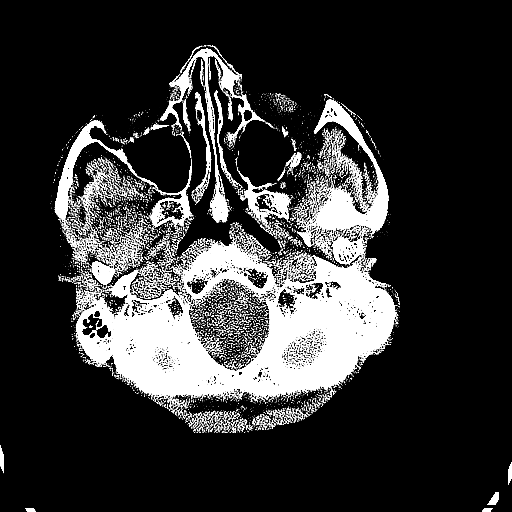
[im 9/90  bone]
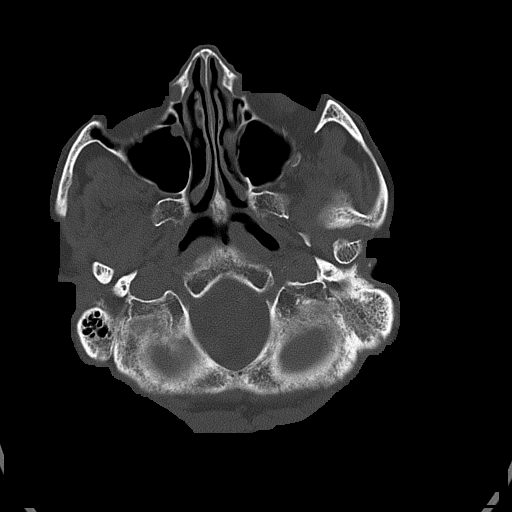
[im 18/90  brain]
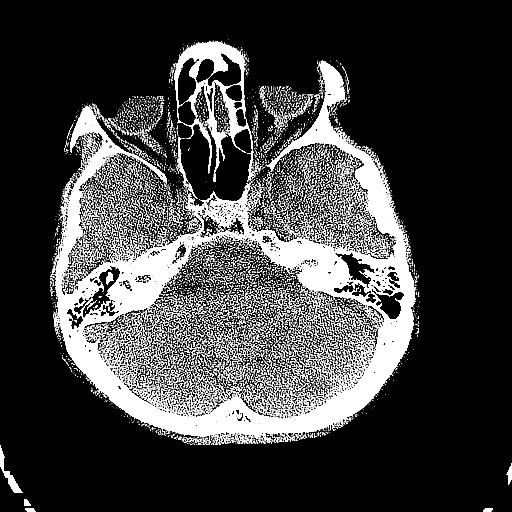
[im 27/90  brain]
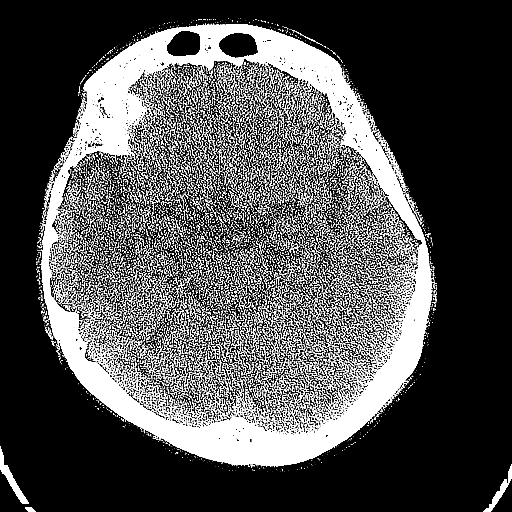
[im 36/90  brain]
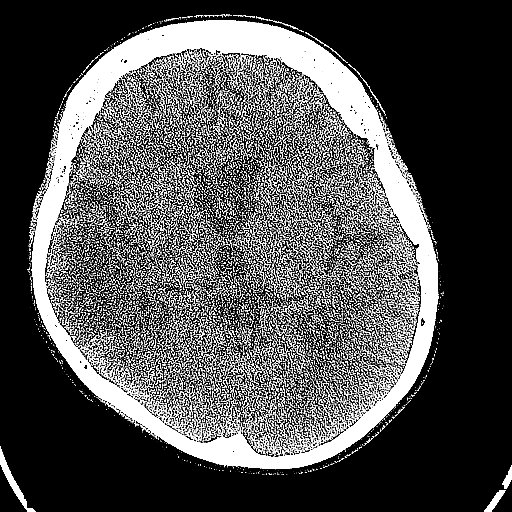
[im 45/90  brain]
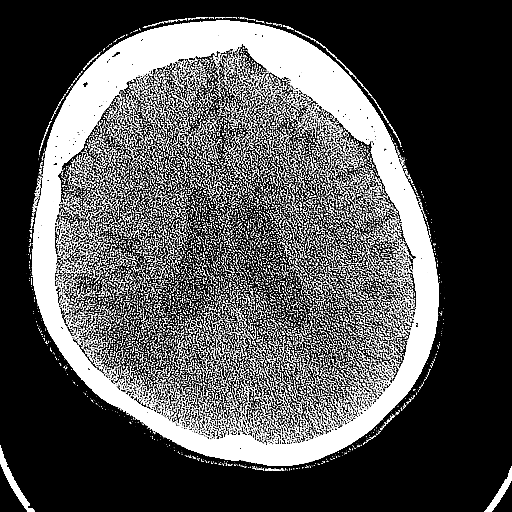
[im 45/90  bone]
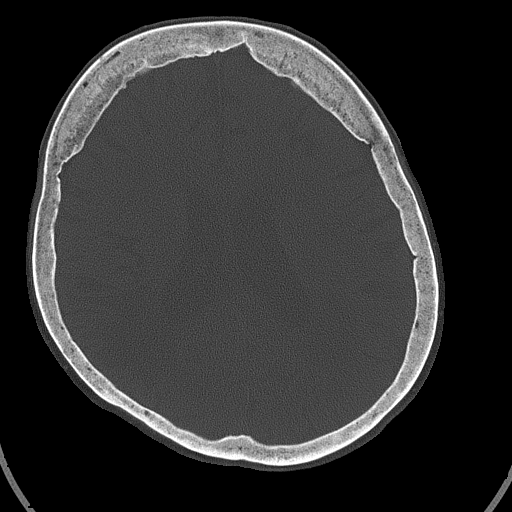
[im 54/90  brain]
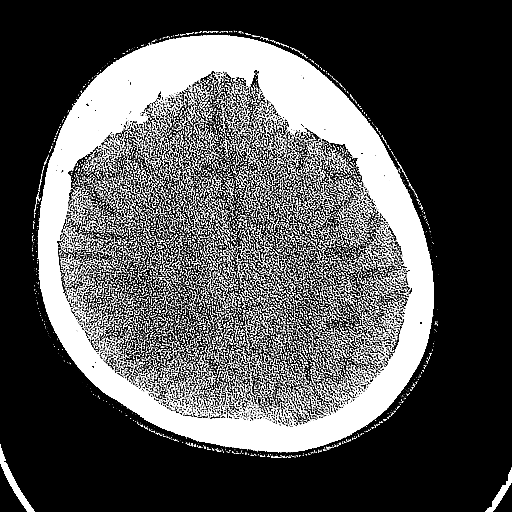
[im 63/90  brain]
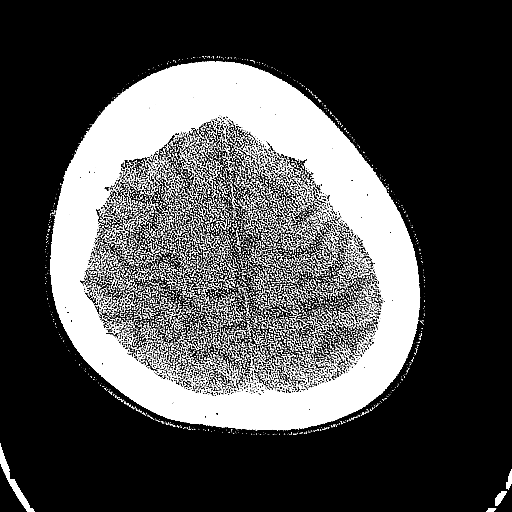
[im 72/90  brain]
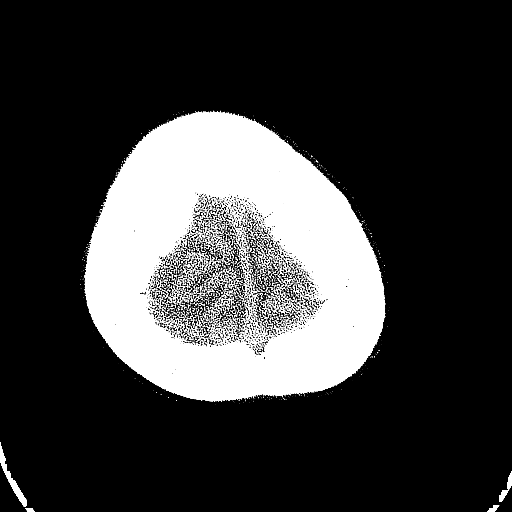
[im 81/90  brain]
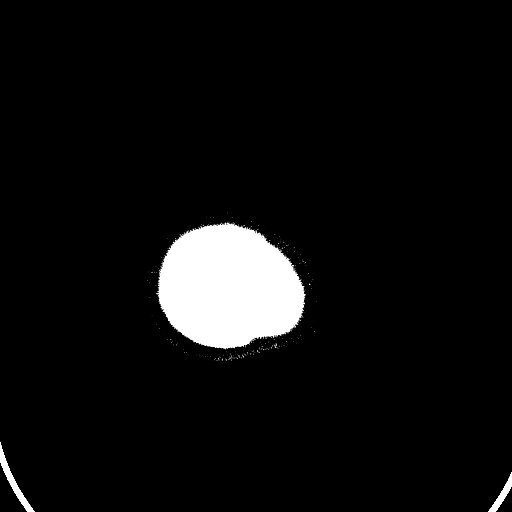
[im 81/90  bone]
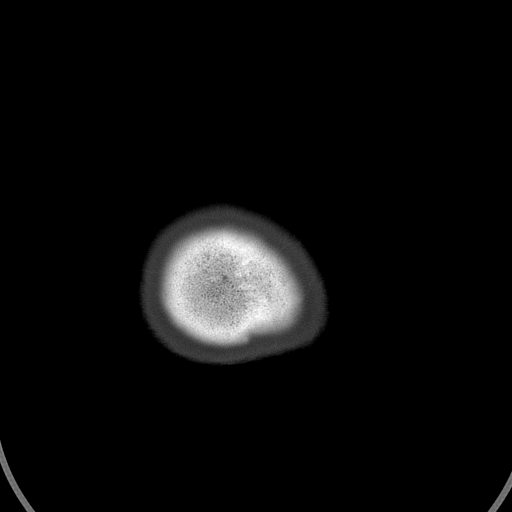

[Series 6: head 3.0 mpr cor · coronal · 0.41mm/px · 3 of 67 slices shown]
[im 23/67  brain]
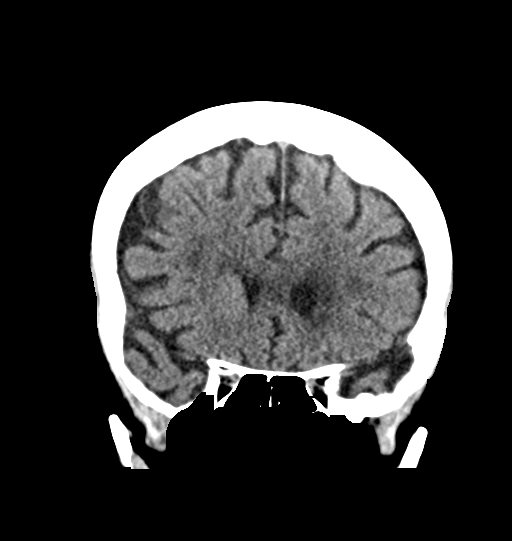
[im 30/67  brain]
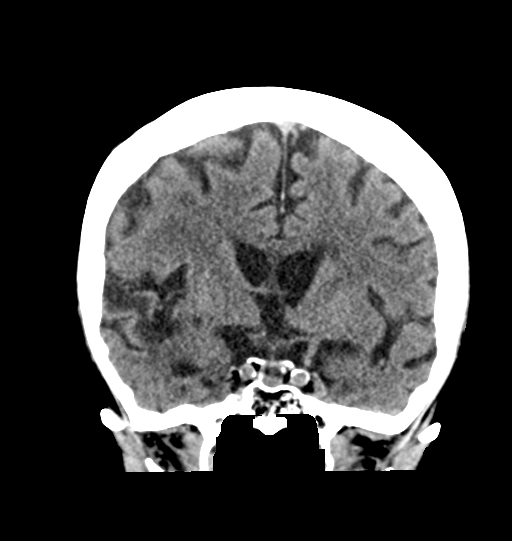
[im 37/67  brain]
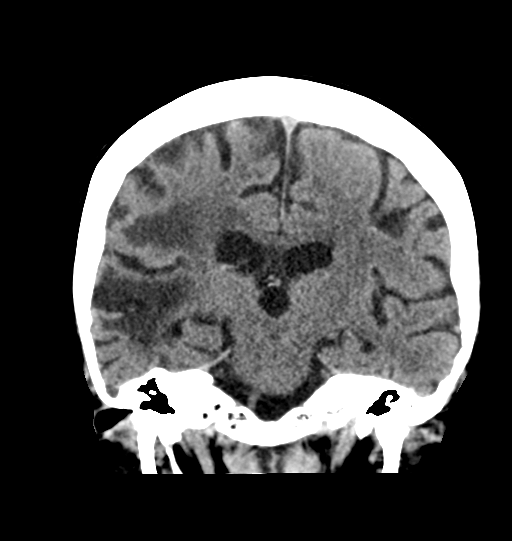

[Series 7: head 3.0 mpr sag · sagittal · 0.41mm/px · 3 of 67 slices shown]
[im 23/67  brain]
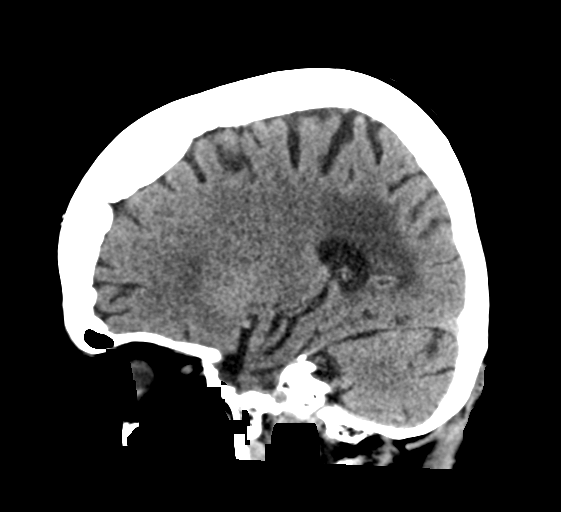
[im 34/67  brain]
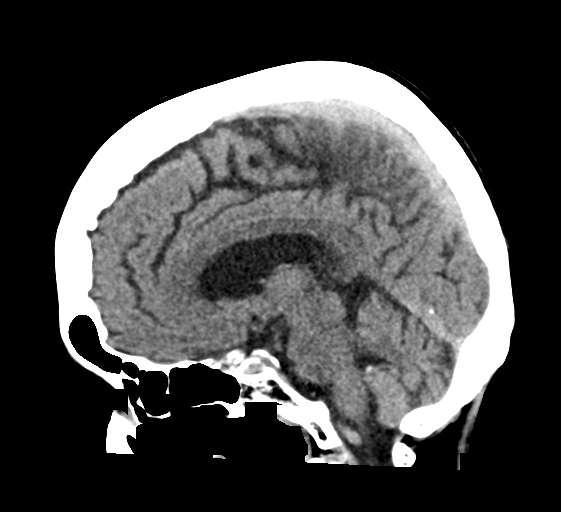
[im 45/67  brain]
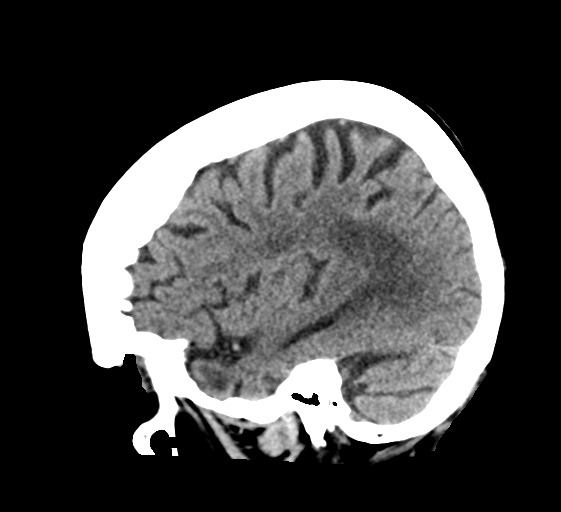

[15 of 47 positions shown; findings below may reference images not displayed]

FINDINGS: Brain: There is a small focus of hyperattenuation in the inferior
right cerebellum (axial image 6, sagittal image 26). There is
generalized atrophy without lobar predilection. There is an old
posterior right MCA territory infarct with associated
encephalomalacia. There is hypoattenuation of the periventricular
white matter, most commonly indicating chronic ischemic
microangiopathy.

Vascular: Atherosclerotic calcification of the internal carotid
arteries at the skull base. No abnormal hyperdensity of the major
intracranial arteries or dural venous sinuses.

Skull: The visualized skull base, calvarium and extracranial soft
tissues are normal.

Sinuses/Orbits: No fluid levels or advanced mucosal thickening of
the visualized paranasal sinuses. No mastoid or middle ear effusion.
The orbits are normal.
IMPRESSION: 1. Small focus of suspected acute to subacute subarachnoid
hemorrhage in the right cerebellum. No associated mass effect. This
may be posttraumatic, but MRI might be helpful to exclude an
associated lesion, such as a cavernoma.
2. Old right posterior MCA territory infarct. Chronic ischemic
microangiopathy.

Critical Value/emergent results were called by telephone at the time
of interpretation on 01/13/2018 at [DATE] to Dr. STEPHANE MAULDIN , who
verbally acknowledged these results.
